# Patient Record
Sex: Female | Born: 1955 | Race: White | Hispanic: No | Marital: Married | Smoking: Never smoker
Health system: Southern US, Community
[De-identification: ages and names within clinical notes are randomized; demographics above are authoritative.]

## PROBLEM LIST (undated history)

## (undated) DIAGNOSIS — I10 Essential (primary) hypertension: Secondary | ICD-10-CM

## (undated) DIAGNOSIS — E119 Type 2 diabetes mellitus without complications: Secondary | ICD-10-CM

## (undated) HISTORY — PX: REPLACEMENT TOTAL KNEE: SUR1224

## (undated) HISTORY — PX: CARPAL TUNNEL RELEASE: SHX101

## (undated) HISTORY — PX: BACK SURGERY: SHX140

## (undated) HISTORY — PX: COLONOSCOPY WITH ESOPHAGOGASTRODUODENOSCOPY (EGD): SHX5779

## (undated) HISTORY — PX: ANKLE HARDWARE REMOVAL: SHX1149

## (undated) HISTORY — PX: FRACTURE SURGERY: SHX138

---

## 1993-12-15 HISTORY — PX: TUBAL LIGATION: SHX77

## 1995-12-16 HISTORY — PX: ABDOMINAL HYSTERECTOMY: SHX81

## 1999-07-17 ENCOUNTER — Encounter: Payer: Self-pay | Admitting: Neurosurgery

## 1999-07-19 ENCOUNTER — Inpatient Hospital Stay (HOSPITAL_COMMUNITY): Admission: RE | Admit: 1999-07-19 | Discharge: 1999-07-20 | Payer: Self-pay | Admitting: Neurosurgery

## 1999-07-19 ENCOUNTER — Encounter: Payer: Self-pay | Admitting: Neurosurgery

## 1999-10-08 ENCOUNTER — Encounter: Admission: RE | Admit: 1999-10-08 | Discharge: 1999-10-08 | Payer: Self-pay | Admitting: Neurosurgery

## 1999-10-08 ENCOUNTER — Encounter: Payer: Self-pay | Admitting: Neurosurgery

## 1999-12-11 ENCOUNTER — Other Ambulatory Visit: Admission: RE | Admit: 1999-12-11 | Discharge: 1999-12-11 | Payer: Self-pay | Admitting: Obstetrics and Gynecology

## 2000-01-24 ENCOUNTER — Encounter: Payer: Self-pay | Admitting: Neurosurgery

## 2000-01-24 ENCOUNTER — Encounter: Admission: RE | Admit: 2000-01-24 | Discharge: 2000-01-24 | Payer: Self-pay | Admitting: Neurosurgery

## 2000-01-28 ENCOUNTER — Encounter: Admission: RE | Admit: 2000-01-28 | Discharge: 2000-02-20 | Payer: Self-pay | Admitting: Neurosurgery

## 2000-02-17 ENCOUNTER — Encounter: Admission: RE | Admit: 2000-02-17 | Discharge: 2000-02-17 | Payer: Self-pay | Admitting: Neurosurgery

## 2000-02-17 ENCOUNTER — Encounter: Payer: Self-pay | Admitting: Neurosurgery

## 2000-02-21 ENCOUNTER — Encounter: Payer: Self-pay | Admitting: Neurosurgery

## 2000-02-21 ENCOUNTER — Ambulatory Visit (HOSPITAL_COMMUNITY): Admission: RE | Admit: 2000-02-21 | Discharge: 2000-02-21 | Payer: Self-pay | Admitting: Neurosurgery

## 2000-04-01 ENCOUNTER — Encounter: Admission: RE | Admit: 2000-04-01 | Discharge: 2000-06-30 | Payer: Self-pay | Admitting: Anesthesiology

## 2000-09-01 ENCOUNTER — Encounter: Admission: RE | Admit: 2000-09-01 | Discharge: 2000-09-01 | Payer: Self-pay | Admitting: Obstetrics and Gynecology

## 2000-09-01 ENCOUNTER — Encounter: Payer: Self-pay | Admitting: Obstetrics and Gynecology

## 2001-02-16 ENCOUNTER — Other Ambulatory Visit: Admission: RE | Admit: 2001-02-16 | Discharge: 2001-02-16 | Payer: Self-pay | Admitting: Obstetrics and Gynecology

## 2001-07-22 ENCOUNTER — Encounter: Payer: Self-pay | Admitting: Neurosurgery

## 2001-07-22 ENCOUNTER — Ambulatory Visit (HOSPITAL_COMMUNITY): Admission: RE | Admit: 2001-07-22 | Discharge: 2001-07-22 | Payer: Self-pay | Admitting: Neurosurgery

## 2001-08-09 ENCOUNTER — Encounter: Payer: Self-pay | Admitting: Neurosurgery

## 2001-08-09 ENCOUNTER — Ambulatory Visit (HOSPITAL_COMMUNITY): Admission: RE | Admit: 2001-08-09 | Discharge: 2001-08-09 | Payer: Self-pay | Admitting: Neurosurgery

## 2001-09-02 ENCOUNTER — Encounter: Payer: Self-pay | Admitting: Family Medicine

## 2001-09-02 ENCOUNTER — Encounter: Admission: RE | Admit: 2001-09-02 | Discharge: 2001-09-02 | Payer: Self-pay | Admitting: Family Medicine

## 2001-09-07 ENCOUNTER — Emergency Department (HOSPITAL_COMMUNITY): Admission: EM | Admit: 2001-09-07 | Discharge: 2001-09-07 | Payer: Self-pay | Admitting: *Deleted

## 2002-02-16 ENCOUNTER — Other Ambulatory Visit: Admission: RE | Admit: 2002-02-16 | Discharge: 2002-02-16 | Payer: Self-pay | Admitting: Obstetrics and Gynecology

## 2002-08-30 ENCOUNTER — Ambulatory Visit (HOSPITAL_COMMUNITY): Admission: RE | Admit: 2002-08-30 | Discharge: 2002-08-30 | Payer: Self-pay | Admitting: Neurosurgery

## 2002-08-30 ENCOUNTER — Encounter: Payer: Self-pay | Admitting: Neurosurgery

## 2002-09-26 ENCOUNTER — Encounter: Payer: Self-pay | Admitting: Family Medicine

## 2002-09-26 ENCOUNTER — Encounter: Admission: RE | Admit: 2002-09-26 | Discharge: 2002-09-26 | Payer: Self-pay | Admitting: Family Medicine

## 2003-10-06 ENCOUNTER — Encounter: Payer: Self-pay | Admitting: Obstetrics and Gynecology

## 2003-10-06 ENCOUNTER — Encounter: Admission: RE | Admit: 2003-10-06 | Discharge: 2003-10-06 | Payer: Self-pay | Admitting: Obstetrics and Gynecology

## 2004-05-29 ENCOUNTER — Encounter: Admission: RE | Admit: 2004-05-29 | Discharge: 2004-05-29 | Payer: Self-pay | Admitting: Family Medicine

## 2004-08-01 ENCOUNTER — Ambulatory Visit (HOSPITAL_COMMUNITY): Admission: RE | Admit: 2004-08-01 | Discharge: 2004-08-01 | Payer: Self-pay | Admitting: Internal Medicine

## 2004-12-13 ENCOUNTER — Encounter: Admission: RE | Admit: 2004-12-13 | Discharge: 2004-12-13 | Payer: Self-pay | Admitting: Obstetrics and Gynecology

## 2004-12-25 ENCOUNTER — Encounter: Admission: RE | Admit: 2004-12-25 | Discharge: 2004-12-25 | Payer: Self-pay | Admitting: Obstetrics and Gynecology

## 2006-02-17 ENCOUNTER — Encounter: Admission: RE | Admit: 2006-02-17 | Discharge: 2006-02-17 | Payer: Self-pay | Admitting: Obstetrics and Gynecology

## 2006-06-08 ENCOUNTER — Encounter: Admission: RE | Admit: 2006-06-08 | Discharge: 2006-06-08 | Payer: Self-pay | Admitting: Neurosurgery

## 2006-09-29 ENCOUNTER — Ambulatory Visit (HOSPITAL_COMMUNITY): Admission: RE | Admit: 2006-09-29 | Discharge: 2006-09-30 | Payer: Self-pay | Admitting: Neurosurgery

## 2007-05-21 ENCOUNTER — Encounter: Admission: RE | Admit: 2007-05-21 | Discharge: 2007-05-21 | Payer: Self-pay | Admitting: Neurosurgery

## 2007-06-28 ENCOUNTER — Ambulatory Visit (HOSPITAL_COMMUNITY): Admission: RE | Admit: 2007-06-28 | Discharge: 2007-06-29 | Payer: Self-pay | Admitting: Chiropractic Medicine

## 2007-08-31 ENCOUNTER — Encounter: Admission: RE | Admit: 2007-08-31 | Discharge: 2007-08-31 | Payer: Self-pay | Admitting: Obstetrics and Gynecology

## 2008-06-17 IMAGING — CR DG LUMBAR SPINE 1V
1 series · 1 of 1 positions shown · non-contrast
Comparison: none

CLINICAL DATA: 51 year old female; L5-S1 microdiskectomy for herniated disk.
DIAGNOSTIC INTRAOPERATIVE LUMBAR SPINE ? 1 VIEW ? 06/28/07:

[view not recorded]
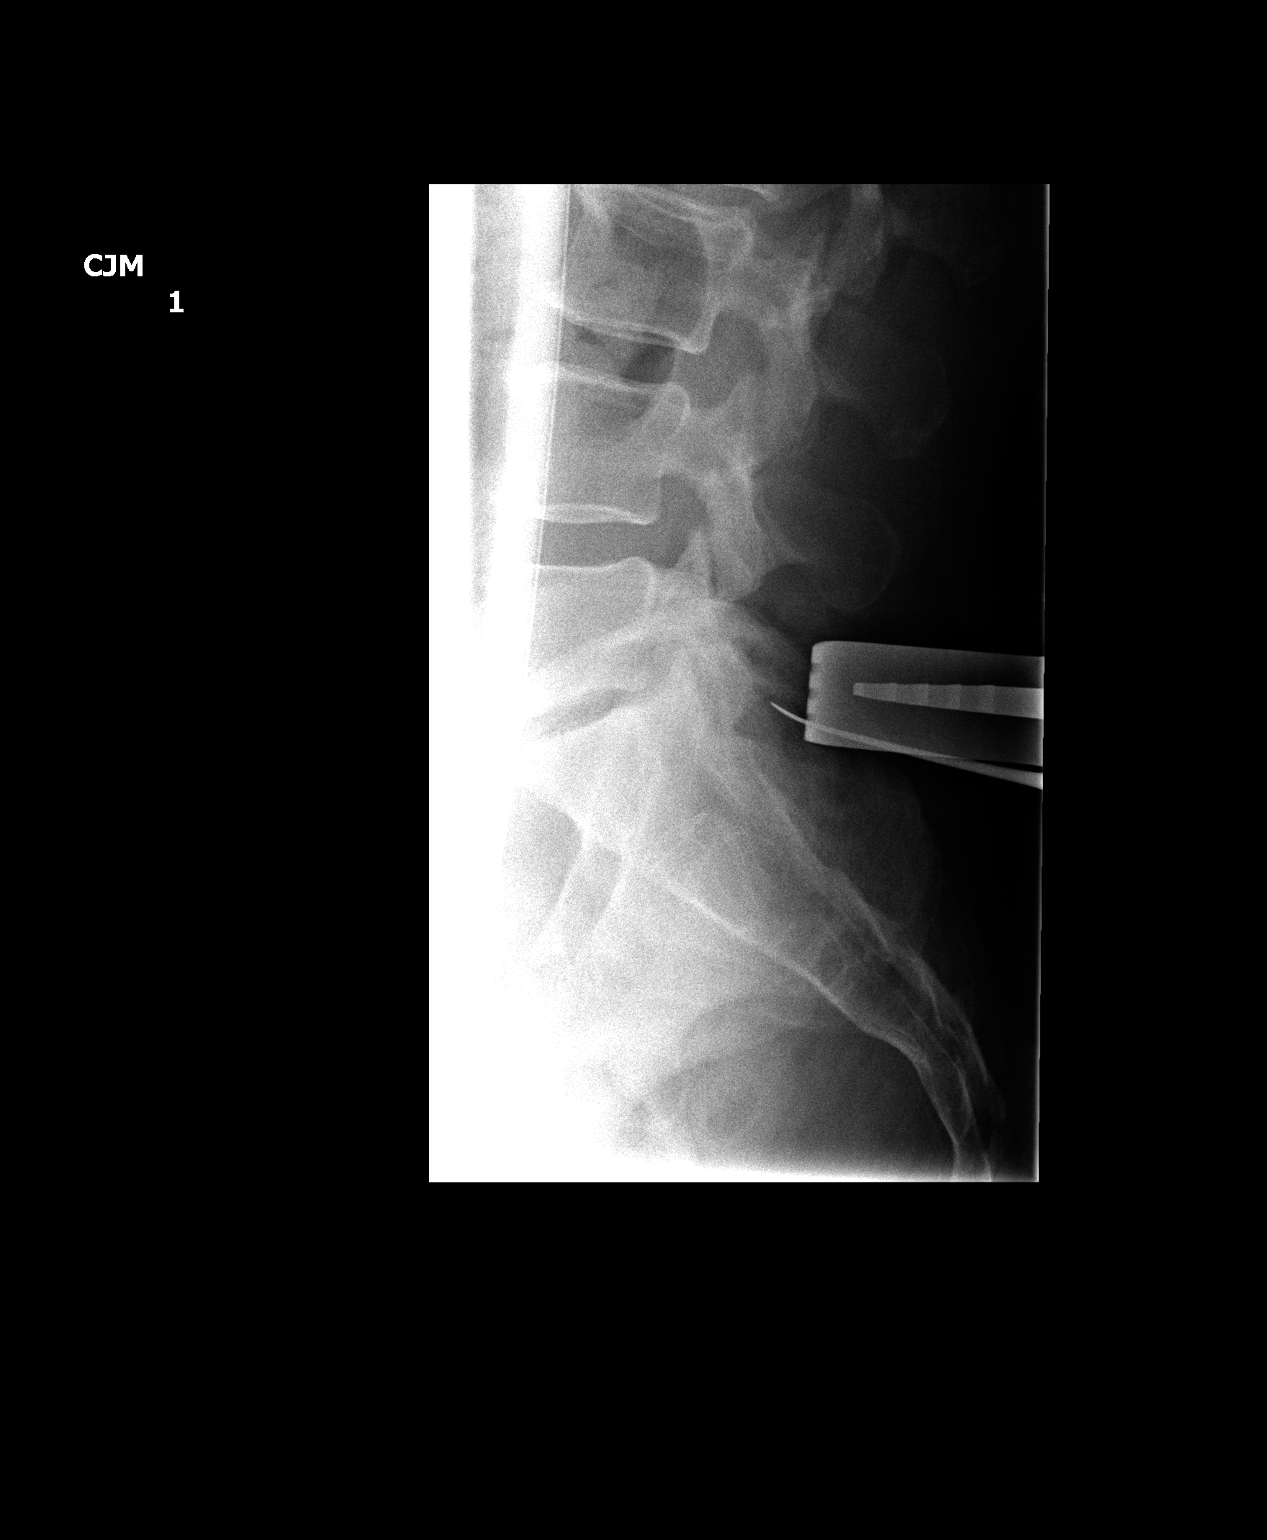

[1 of 1 positions shown; findings below may reference images not displayed]

FINDINGS: Intraoperative lateral lumbar spine demonstrates a surgical retractor and a localizer posterior to L5-S1.
IMPRESSION: Operative localization L5-S1.

## 2008-09-19 ENCOUNTER — Ambulatory Visit (HOSPITAL_BASED_OUTPATIENT_CLINIC_OR_DEPARTMENT_OTHER): Admission: RE | Admit: 2008-09-19 | Discharge: 2008-09-19 | Payer: Self-pay | Admitting: Obstetrics and Gynecology

## 2009-11-27 ENCOUNTER — Ambulatory Visit (HOSPITAL_BASED_OUTPATIENT_CLINIC_OR_DEPARTMENT_OTHER): Admission: RE | Admit: 2009-11-27 | Discharge: 2009-11-27 | Payer: Self-pay | Admitting: Obstetrics and Gynecology

## 2009-11-27 ENCOUNTER — Ambulatory Visit: Payer: Self-pay | Admitting: Diagnostic Radiology

## 2010-12-09 ENCOUNTER — Ambulatory Visit (HOSPITAL_BASED_OUTPATIENT_CLINIC_OR_DEPARTMENT_OTHER)
Admission: RE | Admit: 2010-12-09 | Discharge: 2010-12-09 | Payer: Self-pay | Source: Home / Self Care | Attending: Obstetrics and Gynecology | Admitting: Obstetrics and Gynecology

## 2011-04-29 NOTE — Op Note (Signed)
Brandi Frost, Brandi Frost              ACCOUNT NO.:  [id number]   MEDICAL RECORD NO.:  [id number]          PATIENT TYPE:  OIB   LOCATION:  3008                         FACILITY:  [facility]   PHYSICIAN:  Kathaleen Maser. Pool, M.D.    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   POSTOPERATIVE DIAGNOSES:  Left L5-S1 recurrent herniated nucleus  pulposus with radiculopathy.   PROCEDURE NOTE:  Left L5-S1 re-exploration of laminotomy with a redo  microdiskectomy.   SURGEON:  Kathaleen Maser. Pool, M.D.   ASSISTANT:  Dionne Ano. Amanda Pea, M.D.   ANESTHESIA:  General endotracheal anesthesia.   INDICATIONS FOR PROCEDURE:  This is a 55 year old female with history of  left lower extremity pain, paresthesias and weakness consistent with a  left-sided S1 radiculopathy.  Workup demonstrates evidence of a left-  sided L5-S1 recurrent disk herniation with significant compression of  the left side S-1 nerve root.  The patient has been counseled as to her  options.  She decided to proceed with a left-sided L5-S1 redo  microdiskectomy in hopes of improving her symptoms.   DESCRIPTION OF PROCEDURE:  The patient was brought to the operating  room, placed on the operating table in the supine position.  After  adequate level of anesthesia was achieved, the patient was placed prone  in a Wilson frame and appropriately padded.  The patient's lumbar  regions was prepped and draped sterilely.  A #10 blade was used to make  a skin incision overlying the L5-S1 interspace.  This was carried down  sharply in the midline.  A subperiosteal dissection was then performed,  exposing the lamina of the facet joints of L-5 and S1 on the left side.  Previous laminotomy site of L5-S1 was dissected free.  X-ray was taken,  level was confirmed.  Microscope was  brought into the field.  Microdissection of the laminotomy of L5-S1.  The S1 nerve root was  identified.  The S1 nerve root was then gradually mobilized and  retracted towards the midline.  Disk herniation was apparent.  This was  then incised with a 15 blade in a rectangular fashion.  A wide disk  space clean-out was then achieved using pituitary rongeurs, up going  pituitary rongeurs and Epstein curettes.  All elements of the recurrent  disk herniation were completely resected.  All loose or obviously  degenerative disc material was removed from the interspace.  At this  point a very thorough decompression had been achieved.  There was no  injury to thecal sac or nerve roots.  The wound was then irrigated with  antibiotic solution.  Gelfoam was placed topically.  Hemostasis found to  be good.  Microscope and  retraction system were removed.  Hemostasis of the muscle achieved with  electrocautery.  The wound was closed in layers with Vicryl suture.  Steri-Strips and sterile dressings were applied.  There were no apparent  complications.  The patient tolerated the procedure  well and she  returned to the recovery room postoperatively.           ______________________________  Kathaleen Maser Pool, M.D.     HAP/MEDQ  D:  06/28/2007  T:  06/29/2007  Job:  [id number]

## 2011-05-02 NOTE — Procedures (Signed)
[place]. [facility]  Patient:    Brandi Frost, Brandi Frost                   MRN: [id number] Proc. Date: 04/10/00 Adm. Date:  [id number] Attending:  Thyra Breed CC:         Mena Goes. Franky Macho, M.D.             Vikki Ports, M.D.                           Procedure Report  PROCEDURE:  Right occipital nerve block.  OPERATOR:  Thyra Breed, M.D.  PREOPERATIVE DIAGNOSIS:  Occipital neuralgia.  INDICATIONS:  The patient has noted improvement after her first injection into he right occipital region.  She is interested in another injection today.  She has had no untoward effects.  PHYSICAL EXAMINATION:  VITAL SIGNS:  Blood pressure 140/74, heart rate 93, respirations 16, O2 saturation 98%.  Pain level is 4/10.  Temperature is 97.6 degrees.  NEUROLOGIC:  Grossly unchanged.  DESCRIPTION OF PROCEDURE:  After an informed consent was obtained, I identified the right greater occipital groove and prepped this with Betadine x 3.  Using a mixture of 1% lidocaine to 0.5% levobupivacaine in a ratio of 1:2, I injected 2 cc using a #25 gauge needle.  Fifteen minutes later the patient noted a very good occipital nerve block.  The post-procedural condition is stable.  DISCHARGE INSTRUCTIONS: 1. Resume the previous diet. 2. Limitation in activities per the instruction sheet. 3. Continue with the current medications. 4. Follow up with me next week for the third occipital nerve block. DD:  04/10/00 TD:  04/12/00 Job: [id number]

## 2011-05-02 NOTE — Procedures (Signed)
[place]. [facility]  Patient:    Brandi Frost, Brandi Frost                   MRN: [id number] Proc. Date: 04/17/00 Adm. Date:  [id number] Attending:  Thyra Breed CC:         Mena Goes. Franky Macho, M.D.             Vikki Ports, M.D.                           Procedure Report  NAME OF PROCEDURE:  Right occipital nerve block.  DIAGNOSIS:  Occipital neuralgias and headaches.  ANESTHESIOLOGIST:  Florencia Reasons, M.D.  INTERVAL HISTORY:  The patient has noted improvement, which is lasting about seven days with the injections.  We discussed going ahead and adding in some corticosteroids today, which she is interested in doing.  She has had some mild discomfort on the left side.  PHYSICAL EXAMINATION:  VITAL SIGNS:  Blood pressure is 139/85.  Heart rate is 90.  Respiratory rate is 18.  O2 sat is 96%.  The pain level is 8/10 and temperature is 97.6.  GENERAL:  She exhibits tenderness over the right greater occipital and lesser occipital grooves.  NEUROLOGIC:  Her neuro exam is grossly unchanged.  PROCEDURE:  After informed consent was obtained the patient was placed in the sitting position and monitored.  I went ahead and prepped out the greater and lesser occipital grooves with Betadine x3.  Using a mixture of 1% lidocaine to 0.5% levobupivacaine in a ratio of 1:13 with 5 mg/mL of Medrol I injected 1.5 cc at each site using a 25-gauge needle.  Fifteen minutes later the patient noted good pain control with decreased sensation in the occipital nerve distribution on the right side.  POSTPROCEDURE CONDITION:  Postprocedure condition was stable.  DISCHARGE INSTRUCTIONS: 1. Resume previous diet. 2. Limitation of activities per instruction sheet. 3. Continue on current medications. 4. The patient with plans to follow up with Drs. Cabbell and [place]. 5. I advised her I would like to see her back in nine weeks to consider    repeating the epidural steroid  injection. DD:  04/17/00 TD:  04/21/00 Job: [id number]

## 2011-05-02 NOTE — Op Note (Signed)
Brandi Frost, Brandi Frost              ACCOUNT NO.:  [id number]   MEDICAL RECORD NO.:  [id number]          PATIENT TYPE:  AMB   LOCATION:  SDS                          FACILITY:  [facility]   PHYSICIAN:  Henry A. Pool, M.D.    DATE OF BIRTH:  1956/04/02   DATE OF PROCEDURE:  09/29/2006  DATE OF DISCHARGE:                                 OPERATIVE REPORT   PREOPERATIVE DIAGNOSES:  Left L5-S1 herniated nucleus pulposus with  radiculopathy.   POSTOPERATIVE DIAGNOSES:  Left L5-S1 herniated nucleus pulposus with  radiculopathy.   OPERATION PERFORMED:  Left L5-S1 laminotomy and microdiskectomy.   SURGEON:  Kathaleen Maser. Pool, M.D.   ASSISTANT:  Tia Alert, MD   ANESTHESIA:  General orotracheal.   INDICATIONS FOR PROCEDURE:  The patient is a 55 year old female with a  history of left lower extremity pain, paresthesias and weakness, consistent  with a left-sided S1 radiculopathy.  She has failed conservative management.  Work-up demonstrates evidence of a left-sided L5-S1 disk herniation with  compression of the left-sided S1 nerve root.  The patient has failed  conservative management.  We decided to proceed with a left-sided L5-S1  laminotomy and microdiskectomy in hopes of improving her symptoms.   DESCRIPTION OF PROCEDURE:  The patient was taken to the operating room and  placed on the table in the supine position.  After adequate level of  anesthesia was achieved, the patient was positioned prone onto a Wilson  frame and appropriately padded.  The patient's lumbar region was prepped and  draped sterilely.  A 10 blade was used to make a linear skin incision  overlying the L5-S1 interspace.  This was carried down sharply in the  midline.  A subperiosteal dissection was then performed exposing the lamina  and facet joints of L5 and S1 on the left.  Deep self-retaining retractor  was placed.  Intraoperative x-ray was taken, the level was confirmed.  The  laminotomy was then performed using  high speed drill and Kerrison rongeurs  to remove the inferior edge of the lamina at L5,  medial aspect of the L5-S1  facet joint and the superior rim of the S1 lamina.  Ligamentum flavum was  then elevated and resected in piecemeal fashion using Kerrison rongeurs.  Underlying thecal sac and exiting S1 nerve root were identified.  The  microscope was brought into the field and used for microdissection of the  left-sided S1 nerve root and underlying disk herniation.  Epidural venous  plexus was coagulated and cut.  The thecal sac and S1 nerve root were  mobilized and retracted toward the midline.  Disk herniation was readily  apparent.  This was incised with a 15 blade in rectangular fashion.  A wide  disk space cleanout was then achieved using pituitary rongeurs and upward  angled pituitary rongeurs and Epstein curets.  All elements of disk herniation were completely resected.  All loose or  obviously degenerated disk material was removed from the interspace.  At  this point a very thorough diskectomy had been achieved.  There was no  evidence of injury to thecal  sac or nerve roots.  The wound was then  irrigated with antibiotic solution.  Gelfoam was placed topically for  hemostasis which was found to be good.  Microscope and retractor system were  removed.  Hemostasis  in muscle was achieved with electrocautery.  The wound was then closed in  layers with Vicryl sutures.  Steri-Strips and sterile dressing were applied.  There were no apparent complications.  The patient tolerated the procedure  well and she returned to the recovery room postoperatively.           ______________________________  Kathaleen Maser Pool, M.D.     HAP/MEDQ  D:  09/29/2006  T:  09/30/2006  Job:  [id number]

## 2011-09-30 LAB — TYPE AND SCREEN
ABO/RH(D): B POS
Antibody Screen: NEGATIVE

## 2011-09-30 LAB — BASIC METABOLIC PANEL
BUN: 19
CO2: 28
Chloride: 100
Creatinine, Ser: 0.9
Glucose, Bld: 104 — ABNORMAL HIGH

## 2011-09-30 LAB — CBC
MCHC: 33.5
MCV: 87.5
Platelets: 264
RDW: 13.5
WBC: 8.1

## 2011-09-30 LAB — DIFFERENTIAL
Basophils Relative: 1
Eosinophils Absolute: 0.1
Eosinophils Relative: 1
Neutrophils Relative %: 63

## 2012-04-19 ENCOUNTER — Other Ambulatory Visit (HOSPITAL_BASED_OUTPATIENT_CLINIC_OR_DEPARTMENT_OTHER): Payer: Self-pay | Admitting: Obstetrics and Gynecology

## 2012-04-19 DIAGNOSIS — Z1231 Encounter for screening mammogram for malignant neoplasm of breast: Secondary | ICD-10-CM

## 2012-04-20 ENCOUNTER — Ambulatory Visit (HOSPITAL_BASED_OUTPATIENT_CLINIC_OR_DEPARTMENT_OTHER)
Admission: RE | Admit: 2012-04-20 | Discharge: 2012-04-20 | Disposition: A | Discharge status: Home / Self Care | Payer: BC Managed Care – PPO | Source: Ambulatory Visit | Attending: Obstetrics and Gynecology | Admitting: Obstetrics and Gynecology

## 2012-04-20 DIAGNOSIS — Z1231 Encounter for screening mammogram for malignant neoplasm of breast: Secondary | ICD-10-CM | POA: Insufficient documentation

## 2013-09-15 ENCOUNTER — Other Ambulatory Visit (HOSPITAL_BASED_OUTPATIENT_CLINIC_OR_DEPARTMENT_OTHER): Payer: Self-pay | Admitting: Obstetrics and Gynecology

## 2013-09-15 DIAGNOSIS — Z1231 Encounter for screening mammogram for malignant neoplasm of breast: Secondary | ICD-10-CM

## 2013-09-19 ENCOUNTER — Ambulatory Visit (HOSPITAL_BASED_OUTPATIENT_CLINIC_OR_DEPARTMENT_OTHER)
Admission: RE | Admit: 2013-09-19 | Discharge: 2013-09-19 | Disposition: A | Discharge status: Home / Self Care | Payer: BC Managed Care – PPO | Source: Ambulatory Visit | Attending: Obstetrics and Gynecology | Admitting: Obstetrics and Gynecology

## 2013-09-19 DIAGNOSIS — Z1231 Encounter for screening mammogram for malignant neoplasm of breast: Secondary | ICD-10-CM | POA: Insufficient documentation

## 2014-10-11 ENCOUNTER — Other Ambulatory Visit (HOSPITAL_BASED_OUTPATIENT_CLINIC_OR_DEPARTMENT_OTHER): Payer: Self-pay | Admitting: Obstetrics and Gynecology

## 2014-10-11 DIAGNOSIS — Z1231 Encounter for screening mammogram for malignant neoplasm of breast: Secondary | ICD-10-CM

## 2014-10-17 ENCOUNTER — Ambulatory Visit (HOSPITAL_BASED_OUTPATIENT_CLINIC_OR_DEPARTMENT_OTHER): Payer: BC Managed Care – PPO

## 2014-10-20 ENCOUNTER — Ambulatory Visit (HOSPITAL_BASED_OUTPATIENT_CLINIC_OR_DEPARTMENT_OTHER)
Admission: RE | Admit: 2014-10-20 | Discharge: 2014-10-20 | Disposition: A | Discharge status: Home / Self Care | Payer: BC Managed Care – PPO | Source: Ambulatory Visit | Attending: Obstetrics and Gynecology | Admitting: Obstetrics and Gynecology

## 2014-10-20 DIAGNOSIS — Z1231 Encounter for screening mammogram for malignant neoplasm of breast: Secondary | ICD-10-CM | POA: Insufficient documentation

## 2015-10-10 IMAGING — MG MM DIGITAL SCREENING BILAT W/ CAD
4 series · 4 of 4 positions shown · non-contrast
Comparison: Previous exam(s).

CLINICAL DATA: Screening.

EXAM:
DIGITAL SCREENING BILATERAL MAMMOGRAM WITH CAD

[R CC]
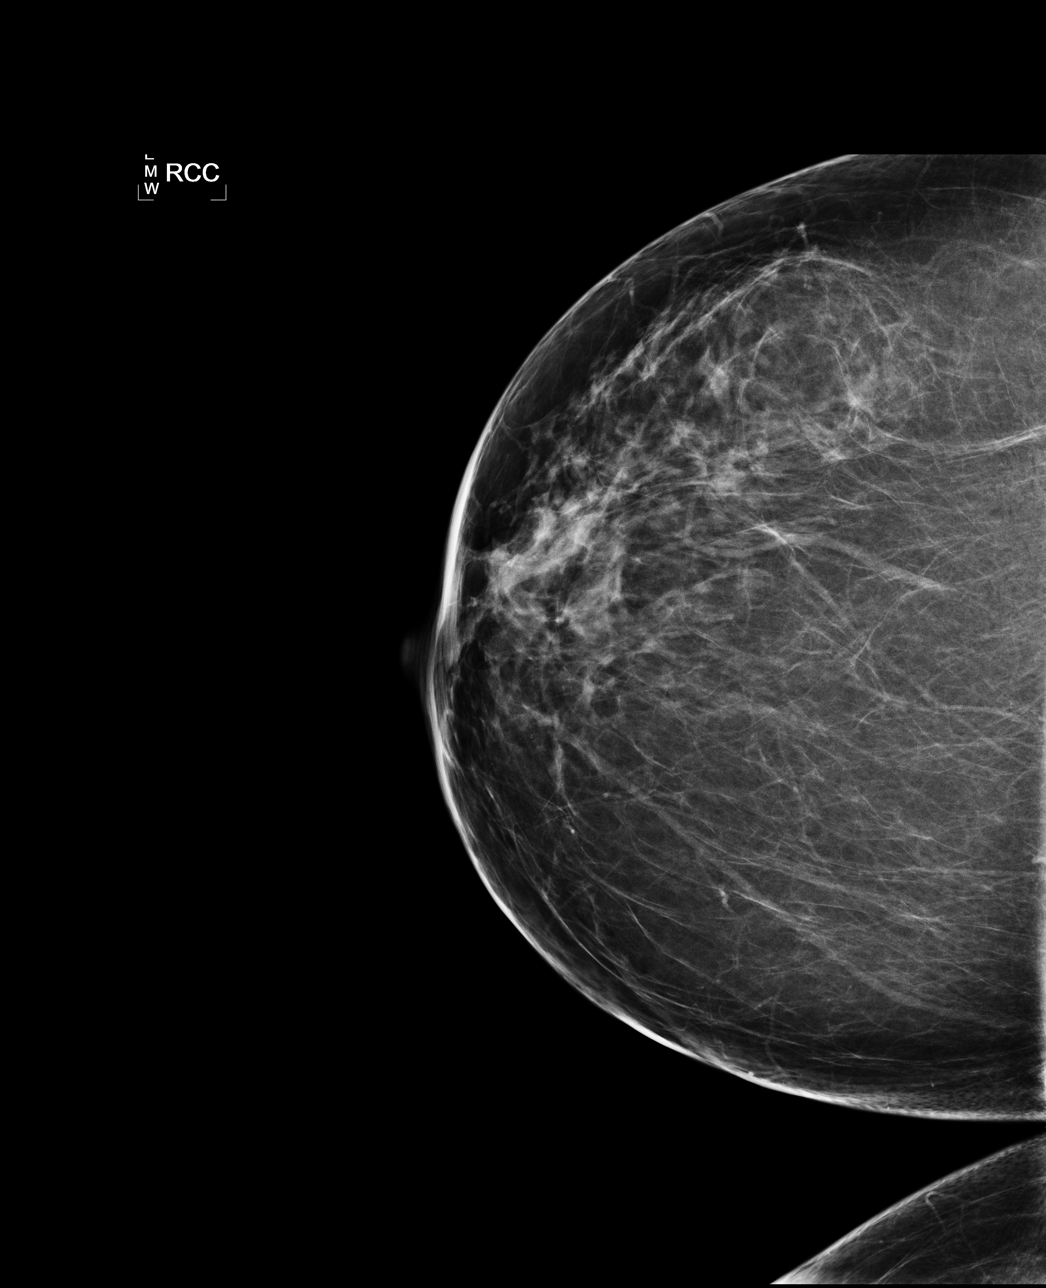

[L CC]
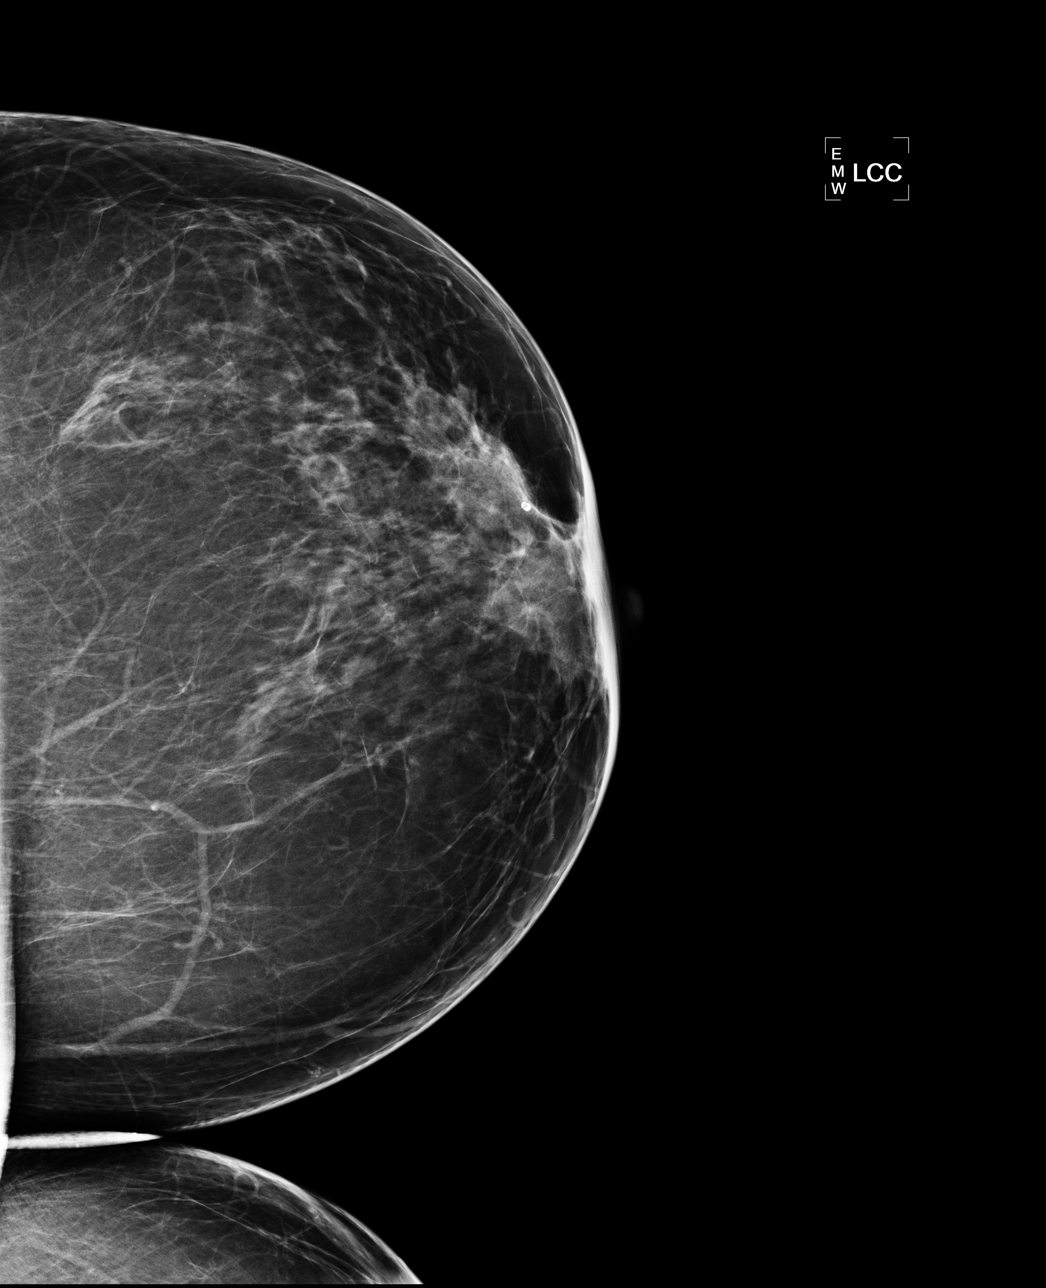

[L MLO]
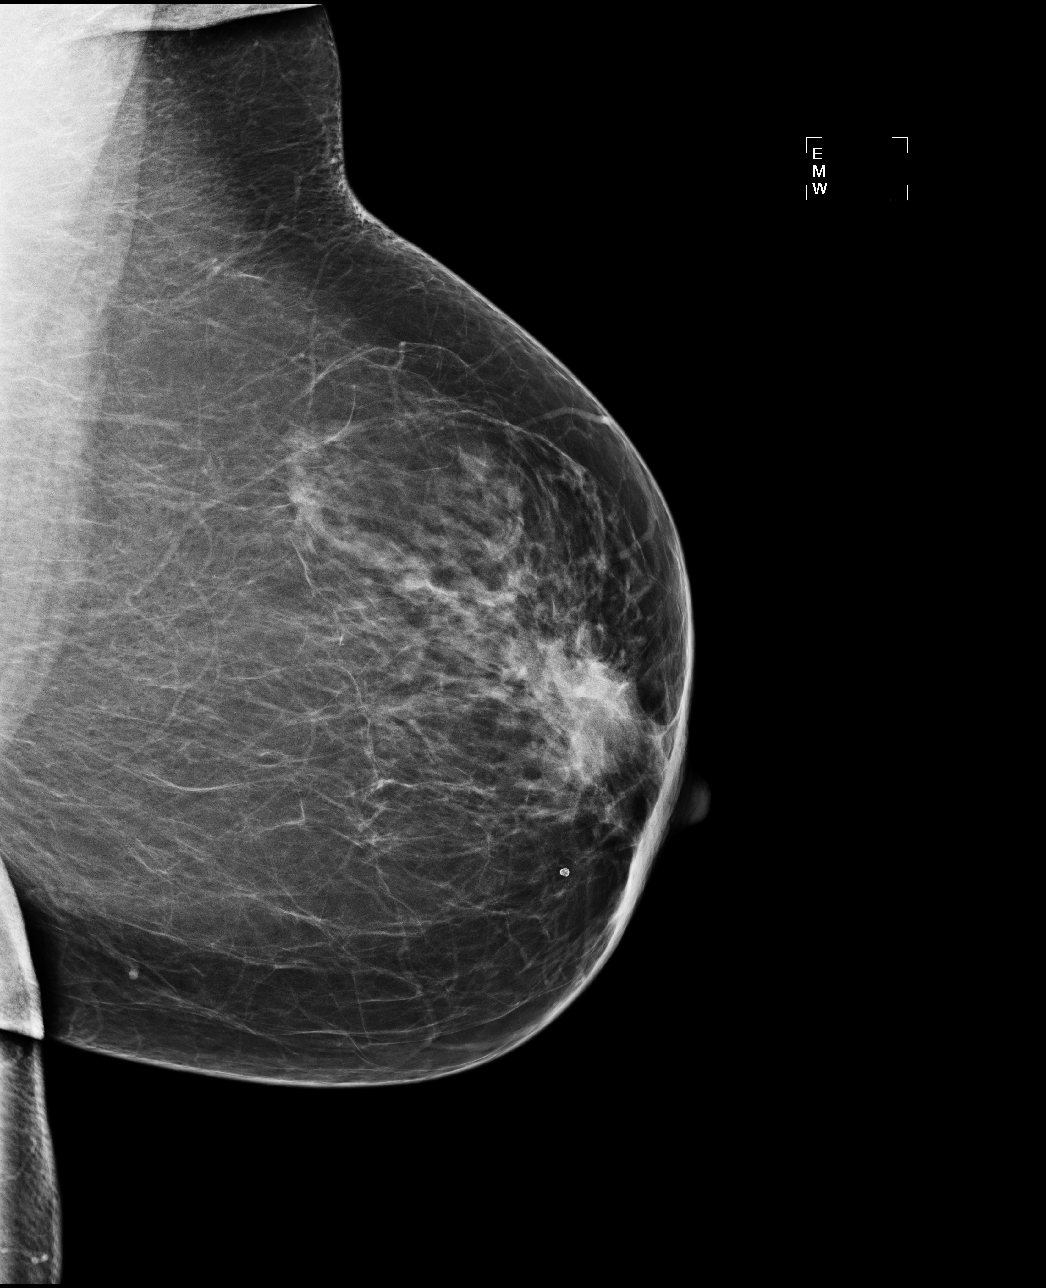

[R MLO]
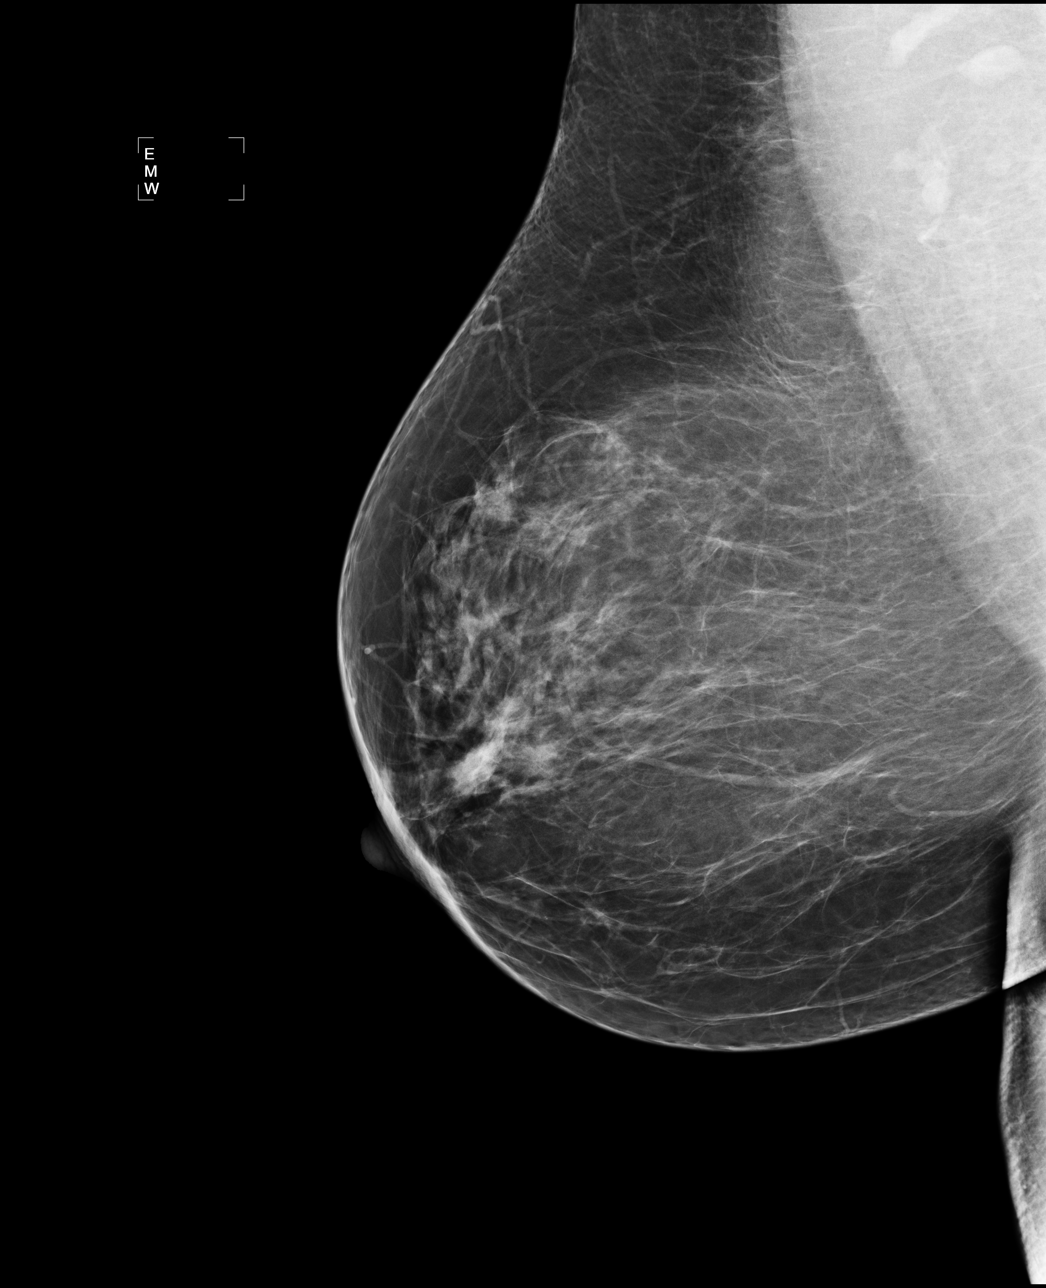

[4 of 4 positions shown; findings below may reference images not displayed]

ACR Breast Density Category b: There are scattered areas of
fibroglandular density.
FINDINGS: There are no findings suspicious for malignancy. Images were
processed with CAD.
IMPRESSION: No mammographic evidence of malignancy. A result letter of this
screening mammogram will be mailed directly to the patient.

RECOMMENDATION:
Screening mammogram in one year. (Code:AS-G-LCT)

BI-RADS CATEGORY  1: Negative.

## 2016-12-15 HISTORY — PX: CHOLECYSTECTOMY: SHX55

## 2017-01-15 ENCOUNTER — Other Ambulatory Visit (HOSPITAL_BASED_OUTPATIENT_CLINIC_OR_DEPARTMENT_OTHER): Payer: Self-pay | Admitting: Obstetrics and Gynecology

## 2017-01-15 DIAGNOSIS — Z1231 Encounter for screening mammogram for malignant neoplasm of breast: Secondary | ICD-10-CM

## 2017-01-26 ENCOUNTER — Ambulatory Visit (HOSPITAL_BASED_OUTPATIENT_CLINIC_OR_DEPARTMENT_OTHER): Payer: Self-pay

## 2020-12-15 HISTORY — PX: COLONOSCOPY: SHX174

## 2022-09-04 ENCOUNTER — Other Ambulatory Visit: Payer: Self-pay | Admitting: Neurosurgery

## 2022-09-16 NOTE — Pre-Procedure Instructions (Signed)
Surgical Instructions    Your procedure is scheduled on September 22, 2022.  Report to [facility] Main Entrance "A" at 6:00 A.M., then check in with the Admitting office.  Call this number if you have problems the morning of surgery:  [phone]   If you have any questions prior to your surgery date call [phone]: Open Monday-Friday 8am-4pm    Remember:  Do not eat or drink after midnight the night before your surgery     Take these medicines the morning of surgery with A SIP OF WATER:  amLODipine (NORVASC)  atorvastatin (LIPITOR)   carvedilol (COREG)  doxazosin (CARDURA)  gabapentin (NEURONTIN)  HYDROcodone-acetaminophen (NORCO) - may take as needed   As of today, STOP taking any Aspirin (unless otherwise instructed by your surgeon) Aleve, Naproxen, Ibuprofen, Motrin, Advil, Goody's, BC's, all herbal medications, fish oil, and all vitamins.   WHAT DO I DO ABOUT MY DIABETES MEDICATION?   Do not take metFORMIN (GLUCOPHAGE-XR) or pioglitazone (ACTOS) the morning of surgery.  If you take glipiZIDE (GLUCOTROL XL) in the evening, DO NOT take evening dose night before surgery   HOW TO MANAGE YOUR DIABETES BEFORE AND AFTER SURGERY  Why is it important to control my blood sugar before and after surgery? Improving blood sugar levels before and after surgery helps healing and can limit problems. A way of improving blood sugar control is eating a healthy diet by:  Eating less sugar and carbohydrates  Increasing activity/exercise  Talking with your doctor about reaching your blood sugar goals High blood sugars (greater than 180 mg/dL) can raise your risk of infections and slow your recovery, so you will need to focus on controlling your diabetes during the weeks before surgery. Make sure that the doctor who takes care of your diabetes knows about your planned surgery including the date and location.  How do I manage my blood sugar before surgery? Check your blood sugar at least  4 times a day, starting 2 days before surgery, to make sure that the level is not too high or low.  Check your blood sugar the morning of your surgery when you wake up and every 2 hours until you get to the Short Stay unit.  If your blood sugar is less than 70 mg/dL, you will need to treat for low blood sugar: Do not take insulin. Treat a low blood sugar (less than 70 mg/dL) with  cup of clear juice (cranberry or apple), 4 glucose tablets, OR glucose gel. Recheck blood sugar in 15 minutes after treatment (to make sure it is greater than 70 mg/dL). If your blood sugar is not greater than 70 mg/dL on recheck, call [phone] for further instructions. Report your blood sugar to the short stay nurse when you get to Short Stay.  If you are admitted to the hospital after surgery: Your blood sugar will be checked by the staff and you will probably be given insulin after surgery (instead of oral diabetes medicines) to make sure you have good blood sugar levels. The goal for blood sugar control after surgery is 80-180 mg/dL.                      Do NOT Smoke (Tobacco/Vaping) for 24 hours prior to your procedure.  If you use a CPAP at night, you may bring your mask/headgear for your overnight stay.   Contacts, glasses, piercing's, hearing aid's, dentures or partials may not be worn into surgery, please bring cases for these belongings.  For patients admitted to the hospital, discharge time will be determined by your treatment team.   Patients discharged the day of surgery will not be allowed to drive home, and someone needs to stay with them for 24 hours.  SURGICAL WAITING ROOM VISITATION Patients having surgery or a procedure may have no more than 2 support people in the waiting area - these visitors may rotate.   Children under the age of 19 must have an adult with them who is not the patient. If the patient needs to stay at the hospital during part of their recovery, the visitor guidelines  for inpatient rooms apply. Pre-op nurse will coordinate an appropriate time for 1 support person to accompany patient in pre-op.  This support person may not rotate.   Please refer to the [facility] website for the visitor guidelines for Inpatients (after your surgery is over and you are in a regular room).    Special instructions:   [place]- Preparing For Surgery  Before surgery, you can play an important role. Because skin is not sterile, your skin needs to be as free of germs as possible. You can reduce the number of germs on your skin by washing with CHG (chlorahexidine gluconate) Soap before surgery.  CHG is an antiseptic cleaner which kills germs and bonds with the skin to continue killing germs even after washing.    Oral Hygiene is also important to reduce your risk of infection.  Remember - BRUSH YOUR TEETH THE MORNING OF SURGERY WITH YOUR REGULAR TOOTHPASTE  Please do not use if you have an allergy to CHG or antibacterial soaps. If your skin becomes reddened/irritated stop using the CHG.  Do not shave (including legs and underarms) for at least 48 hours prior to first CHG shower. It is OK to shave your face.  Please follow these instructions carefully.   Shower the NIGHT BEFORE SURGERY and the MORNING OF SURGERY  If you chose to wash your hair, wash your hair first as usual with your normal shampoo.  After you shampoo, rinse your hair and body thoroughly to remove the shampoo.  Use CHG Soap as you would any other liquid soap. You can apply CHG directly to the skin and wash gently with a scrungie or a clean washcloth.   Apply the CHG Soap to your body ONLY FROM THE NECK DOWN.  Do not use on open wounds or open sores. Avoid contact with your eyes, ears, mouth and genitals (private parts). Wash Face and genitals (private parts)  with your normal soap.   Wash thoroughly, paying special attention to the area where your surgery will be performed.  Thoroughly rinse your body with  warm water from the neck down.  DO NOT shower/wash with your normal soap after using and rinsing off the CHG Soap.  Pat yourself dry with a CLEAN TOWEL.  Wear CLEAN PAJAMAS to bed the night before surgery  Place CLEAN SHEETS on your bed the night before your surgery  DO NOT SLEEP WITH PETS.   Day of Surgery: Take a shower with CHG soap. Do not wear jewelry or makeup Do not wear lotions, powders, perfumes/colognes, or deodorant. Do not shave 48 hours prior to surgery.  Men may shave face and neck. Do not bring valuables to the hospital.  [facility] is not responsible for any belongings or valuables. Do not wear nail polish, gel polish, artificial nails, or any other type of covering on natural nails (fingers and toes) If you have  artificial nails or gel coating that need to be removed by a nail salon, please have this removed prior to surgery. Artificial nails or gel coating may interfere with anesthesia's ability to adequately monitor your vital signs.  Wear Clean/Comfortable clothing the morning of surgery Remember to brush your teeth WITH YOUR REGULAR TOOTHPASTE.   Please read over the following fact sheets that you were given.    If you received a COVID test during your pre-op visit  it is requested that you wear a mask when out in public, stay away from anyone that may not be feeling well and notify your surgeon if you develop symptoms. If you have been in contact with anyone that has tested positive in the last 10 days please notify you surgeon.

## 2022-09-17 ENCOUNTER — Encounter (HOSPITAL_COMMUNITY): Payer: Self-pay

## 2022-09-17 ENCOUNTER — Encounter (HOSPITAL_COMMUNITY)
Admission: RE | Admit: 2022-09-17 | Discharge: 2022-09-17 | Disposition: A | Discharge status: Home / Self Care | Payer: Medicare HMO | Source: Ambulatory Visit | Attending: Neurosurgery | Admitting: Neurosurgery

## 2022-09-17 ENCOUNTER — Other Ambulatory Visit: Payer: Self-pay

## 2022-09-17 VITALS — BP 129/55 | HR 79 | Temp 98.0°F | Resp 19 | Ht 65.0 in | Wt 233.5 lb

## 2022-09-17 DIAGNOSIS — E119 Type 2 diabetes mellitus without complications: Secondary | ICD-10-CM | POA: Diagnosis not present

## 2022-09-17 DIAGNOSIS — R9431 Abnormal electrocardiogram [ECG] [EKG]: Secondary | ICD-10-CM | POA: Diagnosis not present

## 2022-09-17 DIAGNOSIS — Z01818 Encounter for other preprocedural examination: Secondary | ICD-10-CM | POA: Diagnosis present

## 2022-09-17 DIAGNOSIS — E785 Hyperlipidemia, unspecified: Secondary | ICD-10-CM | POA: Diagnosis not present

## 2022-09-17 DIAGNOSIS — I129 Hypertensive chronic kidney disease with stage 1 through stage 4 chronic kidney disease, or unspecified chronic kidney disease: Secondary | ICD-10-CM | POA: Insufficient documentation

## 2022-09-17 HISTORY — DX: Type 2 diabetes mellitus without complications: E11.9

## 2022-09-17 HISTORY — DX: Essential (primary) hypertension: I10

## 2022-09-17 LAB — CBC
HCT: 39.1 % (ref 36.0–46.0)
Hemoglobin: 12.2 g/dL (ref 12.0–15.0)
MCH: 29.4 pg (ref 26.0–34.0)
MCHC: 31.2 g/dL (ref 30.0–36.0)
MCV: 94.2 fL (ref 80.0–100.0)
Platelets: 294 10*3/uL (ref 150–400)
RBC: 4.15 MIL/uL (ref 3.87–5.11)
RDW: 14.6 % (ref 11.5–15.5)
WBC: 9.9 10*3/uL (ref 4.0–10.5)
nRBC: 0 % (ref 0.0–0.2)

## 2022-09-17 LAB — HEMOGLOBIN A1C
Hgb A1c MFr Bld: 6.4 % — ABNORMAL HIGH (ref 4.8–5.6)
Mean Plasma Glucose: 136.98 mg/dL

## 2022-09-17 LAB — TYPE AND SCREEN
ABO/RH(D): B POS
Antibody Screen: NEGATIVE

## 2022-09-17 LAB — GLUCOSE, CAPILLARY: Glucose-Capillary: 92 mg/dL (ref 70–99)

## 2022-09-17 LAB — BASIC METABOLIC PANEL
Anion gap: 8 (ref 5–15)
BUN: 21 mg/dL (ref 8–23)
CO2: 26 mmol/L (ref 22–32)
Calcium: 9.1 mg/dL (ref 8.9–10.3)
Chloride: 107 mmol/L (ref 98–111)
Creatinine, Ser: 1.49 mg/dL — ABNORMAL HIGH (ref 0.44–1.00)
GFR, Estimated: 38 mL/min — ABNORMAL LOW (ref >60)
Glucose, Bld: 94 mg/dL (ref 70–99)
Potassium: 3.9 mmol/L (ref 3.5–5.1)
Sodium: 141 mmol/L (ref 135–145)

## 2022-09-17 LAB — SURGICAL PCR SCREEN
MRSA, PCR: NEGATIVE
Staphylococcus aureus: NEGATIVE

## 2022-09-17 NOTE — Progress Notes (Addendum)
PCP - Dr. Suanne Marker Cardiologist - Dr. Valaria Good  PPM/ICD - Denies Device Orders - n/a Rep Notified - n/a  Chest x-ray - 03/10/2022 EKG - 09/17/2022 Stress Test - 01/23/2021 ECHO - 01/23/2021 Cardiac Cath - Denies  Sleep Study - Denies CPAP - n/a  Pt is DM2. She does not have a blood sugar meter at home and does not check her sugars. Her CBG at PAT appointment was 92. All she has had today was a bowl of Corn Pops Cereal with 2% milk and a swiss cake roll  Blood Thinner Instructions: n/a Aspirin Instructions: n/a  NPO after midnight  COVID TEST- n/a   Anesthesia review: Yes. Abnormal EKG.  Patient denies shortness of breath, fever, cough and chest pain at PAT appointment   All instructions explained to the patient, with a verbal understanding of the material. Patient agrees to go over the instructions while at home for a better understanding. Patient also instructed to self quarantine after being tested for COVID-19. The opportunity to ask questions was provided.

## 2022-09-18 ENCOUNTER — Other Ambulatory Visit (HOSPITAL_COMMUNITY): Payer: Self-pay

## 2022-09-18 NOTE — Progress Notes (Signed)
Anesthesia Chart Review:  Follows with cardiology at [facility] for history of hypertension hyperlipidemia.  She was evaluated February 2022 for complaint of chest pain.  Stress echo 01/23/2021 was negative for ischemia.  She was last seen by Dr. Donley Redder 07/11/2022 and noted to be doing well from cardiac standpoint, chest pain resolved.  1 year follow-up recommended.  Non-insulin-dependent DM2, well controlled, 6.4 on preop labs.  Follows with nephrology at [facility] for history of CKD 3.  Baseline creatinine ~1.4-1.5.  Preop labs reviewed, creatinine mildly elevated 1.49, otherwise WNL.  EKG 09/17/2022: NSR.  Rate 70.  Low voltage QRS.  Septal infarct, age undetermined.  No significant change since last tracing.  Stress echo 01/23/2021 (Care Everywhere): Summary: The patient achieved 88 % of maximum predicted heart rate.  The METS achieved was 4.60. Exercise capacity was poor.  The patient had resting hypertension and became exceedingly  hypertensive with stress.  The patient had no chest pain.   Normal left ventricular function at rest.  The estimated LV ejection fraction is 55-60% .  Normal left ventricular function and global wall motion with stress.  There was normal increase in global LV function post exercise   Negative exercise echocardiography for inducible ischemia at target  heart rate.  Negative stress ECG for inducible ischemia at target heart rate.    Jasmane, [place] [facility] Short Stay Center/Anesthesiology Phone [phone] 09/18/2022 10:45 AM

## 2022-09-18 NOTE — Anesthesia Preprocedure Evaluation (Addendum)
Anesthesia Evaluation  Patient identified by MRN, date of birth, ID band Patient awake    Reviewed: Allergy & Precautions, NPO status , Patient's Chart, lab work & pertinent test results, reviewed documented beta blocker date and time   History of Anesthesia Complications Negative for: history of anesthetic complications  Airway Mallampati: III  TM Distance: >3 FB Neck ROM: Full    Dental  (+) Dental Advisory Given, Chipped   Pulmonary neg pulmonary ROS   Pulmonary exam normal        Cardiovascular hypertension, Pt. on medications and Pt. on home beta blockers Normal cardiovascular exam     Neuro/Psych negative neurological ROS  negative psych ROS   GI/Hepatic negative GI ROS, Neg liver ROS,,,  Endo/Other  diabetes, Type 2, Oral Hypoglycemic Agents   Obesity   Renal/GU CRFRenal disease     Musculoskeletal negative musculoskeletal ROS (+)    Abdominal   Peds  Hematology negative hematology ROS (+)   Anesthesia Other Findings   Reproductive/Obstetrics                             Anesthesia Physical Anesthesia Plan  ASA: 3  Anesthesia Plan: General   Post-op Pain Management: Tylenol PO (pre-op)* and Ketamine IV*   Induction: Intravenous  PONV Risk Score and Plan: 3 and Treatment may vary due to age or medical condition, Ondansetron and Dexamethasone  Airway Management Planned: Oral ETT  Additional Equipment: None  Intra-op Plan:   Post-operative Plan: Extubation in OR  Informed Consent: I have reviewed the patients History and Physical, chart, labs and discussed the procedure including the risks, benefits and alternatives for the proposed anesthesia with the patient or authorized representative who has indicated his/her understanding and acceptance.     Dental advisory given  Plan Discussed with: CRNA and Anesthesiologist  Anesthesia Plan Comments:          Anesthesia Quick Evaluation

## 2022-09-22 ENCOUNTER — Ambulatory Visit (HOSPITAL_COMMUNITY): Payer: Medicare HMO | Admitting: Physician Assistant

## 2022-09-22 ENCOUNTER — Other Ambulatory Visit: Payer: Self-pay

## 2022-09-22 ENCOUNTER — Ambulatory Visit (HOSPITAL_BASED_OUTPATIENT_CLINIC_OR_DEPARTMENT_OTHER): Payer: Medicare HMO | Admitting: Physician Assistant

## 2022-09-22 ENCOUNTER — Encounter (HOSPITAL_COMMUNITY)
Admission: RE | Disposition: A | Discharge status: Home / Self Care | Payer: Self-pay | Source: Ambulatory Visit | Attending: Neurosurgery

## 2022-09-22 ENCOUNTER — Observation Stay (HOSPITAL_COMMUNITY)
Admission: RE | Admit: 2022-09-22 | Discharge: 2022-09-23 | Disposition: A | Discharge status: Home / Self Care | Payer: Medicare HMO | Source: Ambulatory Visit | Attending: Neurosurgery | Admitting: Neurosurgery

## 2022-09-22 ENCOUNTER — Encounter (HOSPITAL_COMMUNITY): Payer: Self-pay | Admitting: Neurosurgery

## 2022-09-22 ENCOUNTER — Ambulatory Visit (HOSPITAL_COMMUNITY): Payer: Medicare HMO

## 2022-09-22 DIAGNOSIS — E119 Type 2 diabetes mellitus without complications: Secondary | ICD-10-CM | POA: Diagnosis not present

## 2022-09-22 DIAGNOSIS — M47816 Spondylosis without myelopathy or radiculopathy, lumbar region: Secondary | ICD-10-CM | POA: Diagnosis not present

## 2022-09-22 DIAGNOSIS — E1122 Type 2 diabetes mellitus with diabetic chronic kidney disease: Secondary | ICD-10-CM | POA: Diagnosis not present

## 2022-09-22 DIAGNOSIS — Z7984 Long term (current) use of oral hypoglycemic drugs: Secondary | ICD-10-CM | POA: Diagnosis not present

## 2022-09-22 DIAGNOSIS — M4316 Spondylolisthesis, lumbar region: Secondary | ICD-10-CM | POA: Diagnosis not present

## 2022-09-22 DIAGNOSIS — N189 Chronic kidney disease, unspecified: Secondary | ICD-10-CM

## 2022-09-22 DIAGNOSIS — I10 Essential (primary) hypertension: Secondary | ICD-10-CM | POA: Diagnosis not present

## 2022-09-22 DIAGNOSIS — Z96653 Presence of artificial knee joint, bilateral: Secondary | ICD-10-CM | POA: Insufficient documentation

## 2022-09-22 DIAGNOSIS — M48061 Spinal stenosis, lumbar region without neurogenic claudication: Secondary | ICD-10-CM | POA: Diagnosis not present

## 2022-09-22 DIAGNOSIS — I129 Hypertensive chronic kidney disease with stage 1 through stage 4 chronic kidney disease, or unspecified chronic kidney disease: Secondary | ICD-10-CM

## 2022-09-22 DIAGNOSIS — Z79899 Other long term (current) drug therapy: Secondary | ICD-10-CM | POA: Diagnosis not present

## 2022-09-22 DIAGNOSIS — M4310 Spondylolisthesis, site unspecified: Secondary | ICD-10-CM | POA: Diagnosis present

## 2022-09-22 LAB — GLUCOSE, CAPILLARY
Glucose-Capillary: 168 mg/dL — ABNORMAL HIGH (ref 70–99)
Glucose-Capillary: 151 mg/dL — ABNORMAL HIGH (ref 70–99)
Glucose-Capillary: 190 mg/dL — ABNORMAL HIGH (ref 70–99)
Glucose-Capillary: 175 mg/dL — ABNORMAL HIGH (ref 70–99)
Glucose-Capillary: 187 mg/dL — ABNORMAL HIGH (ref 70–99)
Glucose-Capillary: 257 mg/dL — ABNORMAL HIGH (ref 70–99)
Glucose-Capillary: 257 mg/dL — ABNORMAL HIGH (ref 70–99)

## 2022-09-22 SURGERY — POSTERIOR LUMBAR FUSION 1 LEVEL
Anesthesia: General | Site: Back

## 2022-09-22 MED ORDER — ONDANSETRON HCL 4 MG/2ML IJ SOLN
4.0000 mg | Freq: Four times a day (QID) | INTRAMUSCULAR | Status: DC | PRN
  Administered 2022-09-22: 4 mg via INTRAVENOUS
  Filled 2022-09-22: qty 2

## 2022-09-22 MED ORDER — FENTANYL CITRATE (PF) 100 MCG/2ML IJ SOLN
INTRAMUSCULAR | Status: AC
  Filled 2022-09-22: qty 2

## 2022-09-22 MED ORDER — HYDROMORPHONE HCL 1 MG/ML IJ SOLN: 1.0000 mg | INTRAMUSCULAR | Status: DC | PRN

## 2022-09-22 MED ORDER — VANCOMYCIN HCL 1000 MG IV SOLR
INTRAVENOUS | Status: DC | PRN
  Administered 2022-09-22: 1000 mg

## 2022-09-22 MED ORDER — CEFAZOLIN SODIUM-DEXTROSE 2-4 GM/100ML-% IV SOLN
INTRAVENOUS | Status: AC
  Filled 2022-09-22: qty 100

## 2022-09-22 MED ORDER — CHLORHEXIDINE GLUCONATE CLOTH 2 % EX PADS: 6.0000 | MEDICATED_PAD | Freq: Once | CUTANEOUS | Status: DC

## 2022-09-22 MED ORDER — POLYETHYLENE GLYCOL 3350 17 G PO PACK: 17.0000 g | PACK | Freq: Every day | ORAL | Status: DC | PRN

## 2022-09-22 MED ORDER — OXYCODONE HCL 5 MG/5ML PO SOLN: 5.0000 mg | Freq: Once | ORAL | Status: AC | PRN

## 2022-09-22 MED ORDER — LIDOCAINE 2% (20 MG/ML) 5 ML SYRINGE
INTRAMUSCULAR | Status: DC | PRN
  Administered 2022-09-22: 80 mg via INTRAVENOUS

## 2022-09-22 MED ORDER — ACETAMINOPHEN 650 MG RE SUPP: 650.0000 mg | RECTAL | Status: DC | PRN

## 2022-09-22 MED ORDER — SODIUM CHLORIDE 0.9 % IV SOLN
250.0000 mL | INTRAVENOUS | Status: DC
  Administered 2022-09-22: 250 mL via INTRAVENOUS

## 2022-09-22 MED ORDER — ACETAMINOPHEN 500 MG PO TABS
1000.0000 mg | ORAL_TABLET | Freq: Once | ORAL | Status: AC
  Administered 2022-09-22: 1000 mg via ORAL
  Filled 2022-09-22: qty 2

## 2022-09-22 MED ORDER — PROPOFOL 10 MG/ML IV BOLUS
INTRAVENOUS | Status: AC
  Filled 2022-09-22: qty 20

## 2022-09-22 MED ORDER — SODIUM CHLORIDE 0.9% FLUSH: 3.0000 mL | INTRAVENOUS | Status: DC | PRN

## 2022-09-22 MED ORDER — ROCURONIUM BROMIDE 10 MG/ML (PF) SYRINGE
PREFILLED_SYRINGE | INTRAVENOUS | Status: AC
  Filled 2022-09-22: qty 10

## 2022-09-22 MED ORDER — INSULIN ASPART 100 UNIT/ML IJ SOLN
0.0000 [IU] | INTRAMUSCULAR | Status: AC | PRN
  Administered 2022-09-22: 2 [IU] via SUBCUTANEOUS
  Administered 2022-09-22: 4 [IU] via SUBCUTANEOUS
  Filled 2022-09-22: qty 1

## 2022-09-22 MED ORDER — ONDANSETRON HCL 4 MG/2ML IJ SOLN
INTRAMUSCULAR | Status: DC | PRN
  Administered 2022-09-22: 4 mg via INTRAVENOUS

## 2022-09-22 MED ORDER — KETAMINE HCL-SODIUM CHLORIDE 100-0.9 MG/10ML-% IV SOSY
PREFILLED_SYRINGE | INTRAVENOUS | Status: DC | PRN
  Administered 2022-09-22: 20 mg via INTRAVENOUS
  Administered 2022-09-22: 10 mg via INTRAVENOUS
  Administered 2022-09-22: 20 mg via INTRAVENOUS

## 2022-09-22 MED ORDER — CHLORHEXIDINE GLUCONATE 0.12 % MT SOLN
15.0000 mL | Freq: Once | OROMUCOSAL | Status: AC
  Administered 2022-09-22: 15 mL via OROMUCOSAL
  Filled 2022-09-22: qty 15

## 2022-09-22 MED ORDER — ATORVASTATIN CALCIUM 40 MG PO TABS
40.0000 mg | ORAL_TABLET | Freq: Every day | ORAL | Status: DC
  Administered 2022-09-22: 40 mg via ORAL
  Filled 2022-09-22 (×2): qty 1

## 2022-09-22 MED ORDER — PROPOFOL 10 MG/ML IV BOLUS
INTRAVENOUS | Status: DC | PRN
  Administered 2022-09-22: 150 mg via INTRAVENOUS

## 2022-09-22 MED ORDER — ACETAMINOPHEN 325 MG PO TABS: 650.0000 mg | ORAL_TABLET | ORAL | Status: DC | PRN

## 2022-09-22 MED ORDER — GABAPENTIN 300 MG PO CAPS
300.0000 mg | ORAL_CAPSULE | Freq: Every morning | ORAL | Status: DC
  Administered 2022-09-22 – 2022-09-23 (×2): 300 mg via ORAL
  Filled 2022-09-22 (×3): qty 1

## 2022-09-22 MED ORDER — MAGNESIUM OXIDE -MG SUPPLEMENT 400 (240 MG) MG PO TABS
400.0000 mg | ORAL_TABLET | Freq: Every day | ORAL | Status: DC
  Administered 2022-09-23: 400 mg via ORAL
  Filled 2022-09-22: qty 1

## 2022-09-22 MED ORDER — FENTANYL CITRATE (PF) 250 MCG/5ML IJ SOLN
INTRAMUSCULAR | Status: AC
  Filled 2022-09-22: qty 5

## 2022-09-22 MED ORDER — ROCURONIUM BROMIDE 10 MG/ML (PF) SYRINGE
PREFILLED_SYRINGE | INTRAVENOUS | Status: DC | PRN
  Administered 2022-09-22: 50 mg via INTRAVENOUS

## 2022-09-22 MED ORDER — SUCCINYLCHOLINE CHLORIDE 200 MG/10ML IV SOSY
PREFILLED_SYRINGE | INTRAVENOUS | Status: AC
  Filled 2022-09-22: qty 10

## 2022-09-22 MED ORDER — MIDAZOLAM HCL 5 MG/5ML IJ SOLN
INTRAMUSCULAR | Status: DC | PRN
  Administered 2022-09-22: 2 mg via INTRAVENOUS

## 2022-09-22 MED ORDER — THROMBIN 20000 UNITS EX SOLR
CUTANEOUS | Status: DC | PRN
  Administered 2022-09-22: 20 mL

## 2022-09-22 MED ORDER — PIOGLITAZONE HCL 15 MG PO TABS
15.0000 mg | ORAL_TABLET | Freq: Every day | ORAL | Status: DC
  Administered 2022-09-23: 15 mg via ORAL
  Filled 2022-09-22: qty 1

## 2022-09-22 MED ORDER — AMLODIPINE BESYLATE 5 MG PO TABS
10.0000 mg | ORAL_TABLET | Freq: Every morning | ORAL | Status: DC
  Administered 2022-09-23: 10 mg via ORAL
  Filled 2022-09-22: qty 1
  Filled 2022-09-22: qty 2

## 2022-09-22 MED ORDER — METFORMIN HCL ER 500 MG PO TB24
500.0000 mg | ORAL_TABLET | Freq: Every day | ORAL | Status: DC
  Administered 2022-09-23: 500 mg via ORAL
  Filled 2022-09-22: qty 1

## 2022-09-22 MED ORDER — FENTANYL CITRATE (PF) 250 MCG/5ML IJ SOLN
INTRAMUSCULAR | Status: DC | PRN
  Administered 2022-09-22: 100 ug via INTRAVENOUS
  Administered 2022-09-22: 50 ug via INTRAVENOUS

## 2022-09-22 MED ORDER — LACTATED RINGERS IV SOLN: INTRAVENOUS | Status: DC

## 2022-09-22 MED ORDER — FENTANYL CITRATE (PF) 100 MCG/2ML IJ SOLN
25.0000 ug | INTRAMUSCULAR | Status: DC | PRN
  Administered 2022-09-22 (×3): 50 ug via INTRAVENOUS

## 2022-09-22 MED ORDER — PHENOL 1.4 % MT LIQD: 1.0000 | OROMUCOSAL | Status: DC | PRN

## 2022-09-22 MED ORDER — CALCIUM CARBONATE 1250 (500 CA) MG PO TABS
1250.0000 mg | ORAL_TABLET | Freq: Every day | ORAL | Status: DC
  Administered 2022-09-23: 1250 mg via ORAL
  Filled 2022-09-22: qty 1

## 2022-09-22 MED ORDER — MIDAZOLAM HCL 2 MG/2ML IJ SOLN
INTRAMUSCULAR | Status: AC
  Filled 2022-09-22: qty 2

## 2022-09-22 MED ORDER — OXYCODONE HCL 5 MG PO TABS
ORAL_TABLET | ORAL | Status: AC
  Filled 2022-09-22: qty 1

## 2022-09-22 MED ORDER — ONDANSETRON HCL 4 MG/2ML IJ SOLN: 4.0000 mg | Freq: Once | INTRAMUSCULAR | Status: DC | PRN

## 2022-09-22 MED ORDER — BISACODYL 10 MG RE SUPP: 10.0000 mg | Freq: Every day | RECTAL | Status: DC | PRN

## 2022-09-22 MED ORDER — CHLORTHALIDONE 25 MG PO TABS
50.0000 mg | ORAL_TABLET | Freq: Every morning | ORAL | Status: DC
  Administered 2022-09-22 – 2022-09-23 (×2): 50 mg via ORAL
  Filled 2022-09-22: qty 2
  Filled 2022-09-22: qty 1
  Filled 2022-09-22: qty 2

## 2022-09-22 MED ORDER — VITAMIN D 25 MCG (1000 UNIT) PO TABS
1000.0000 [IU] | ORAL_TABLET | Freq: Every morning | ORAL | Status: DC
  Administered 2022-09-22 – 2022-09-23 (×2): 1000 [IU] via ORAL
  Filled 2022-09-22 (×3): qty 1

## 2022-09-22 MED ORDER — PHENYLEPHRINE 80 MCG/ML (10ML) SYRINGE FOR IV PUSH (FOR BLOOD PRESSURE SUPPORT)
PREFILLED_SYRINGE | INTRAVENOUS | Status: DC | PRN
  Administered 2022-09-22: 80 ug via INTRAVENOUS

## 2022-09-22 MED ORDER — VANCOMYCIN HCL 1000 MG IV SOLR
INTRAVENOUS | Status: AC
  Filled 2022-09-22: qty 20

## 2022-09-22 MED ORDER — CEFAZOLIN SODIUM-DEXTROSE 2-4 GM/100ML-% IV SOLN
2.0000 g | INTRAVENOUS | Status: AC
  Administered 2022-09-22: 2 g via INTRAVENOUS

## 2022-09-22 MED ORDER — HYDROMORPHONE HCL 1 MG/ML IJ SOLN
INTRAMUSCULAR | Status: AC
  Filled 2022-09-22: qty 1

## 2022-09-22 MED ORDER — ORAL CARE MOUTH RINSE: 15.0000 mL | Freq: Once | OROMUCOSAL | Status: AC

## 2022-09-22 MED ORDER — SODIUM CHLORIDE 0.9% FLUSH
3.0000 mL | Freq: Two times a day (BID) | INTRAVENOUS | Status: DC
  Administered 2022-09-22: 3 mL via INTRAVENOUS

## 2022-09-22 MED ORDER — BUPIVACAINE HCL (PF) 0.25 % IJ SOLN
INTRAMUSCULAR | Status: DC | PRN
  Administered 2022-09-22: 20 mL

## 2022-09-22 MED ORDER — DEXAMETHASONE SODIUM PHOSPHATE 10 MG/ML IJ SOLN
INTRAMUSCULAR | Status: AC
  Filled 2022-09-22: qty 1

## 2022-09-22 MED ORDER — DOXAZOSIN MESYLATE 1 MG PO TABS
1.0000 mg | ORAL_TABLET | Freq: Every day | ORAL | Status: DC
  Filled 2022-09-22 (×2): qty 1

## 2022-09-22 MED ORDER — 0.9 % SODIUM CHLORIDE (POUR BTL) OPTIME
TOPICAL | Status: DC | PRN
  Administered 2022-09-22: 1000 mL

## 2022-09-22 MED ORDER — FLEET ENEMA 7-19 GM/118ML RE ENEM: 1.0000 | ENEMA | Freq: Once | RECTAL | Status: DC | PRN

## 2022-09-22 MED ORDER — DIAZEPAM 5 MG PO TABS
5.0000 mg | ORAL_TABLET | Freq: Four times a day (QID) | ORAL | Status: DC | PRN
  Administered 2022-09-22 (×2): 5 mg via ORAL
  Filled 2022-09-22 (×2): qty 1

## 2022-09-22 MED ORDER — LIDOCAINE 2% (20 MG/ML) 5 ML SYRINGE
INTRAMUSCULAR | Status: AC
  Filled 2022-09-22: qty 5

## 2022-09-22 MED ORDER — HYDROMORPHONE HCL 1 MG/ML IJ SOLN
0.2500 mg | INTRAMUSCULAR | Status: DC | PRN
  Administered 2022-09-22 (×4): 0.5 mg via INTRAVENOUS

## 2022-09-22 MED ORDER — MENTHOL 3 MG MT LOZG: 1.0000 | LOZENGE | OROMUCOSAL | Status: DC | PRN

## 2022-09-22 MED ORDER — ONDANSETRON HCL 4 MG/2ML IJ SOLN
INTRAMUSCULAR | Status: AC
  Filled 2022-09-22: qty 2

## 2022-09-22 MED ORDER — HYDROCODONE-ACETAMINOPHEN 10-325 MG PO TABS: 1.0000 | ORAL_TABLET | ORAL | Status: DC | PRN

## 2022-09-22 MED ORDER — HYDROMORPHONE HCL 1 MG/ML IJ SOLN
INTRAMUSCULAR | Status: AC
  Administered 2022-09-22: 1 mg via INTRAVENOUS
  Filled 2022-09-22: qty 1

## 2022-09-22 MED ORDER — ONDANSETRON HCL 4 MG PO TABS: 4.0000 mg | ORAL_TABLET | Freq: Four times a day (QID) | ORAL | Status: DC | PRN

## 2022-09-22 MED ORDER — EPHEDRINE SULFATE-NACL 50-0.9 MG/10ML-% IV SOSY
PREFILLED_SYRINGE | INTRAVENOUS | Status: DC | PRN
  Administered 2022-09-22 (×2): 5 mg via INTRAVENOUS

## 2022-09-22 MED ORDER — OXYCODONE HCL 5 MG PO TABS
5.0000 mg | ORAL_TABLET | Freq: Once | ORAL | Status: AC | PRN
  Administered 2022-09-22: 5 mg via ORAL

## 2022-09-22 MED ORDER — CYANOCOBALAMIN 500 MCG PO TABS
2500.0000 ug | ORAL_TABLET | Freq: Every day | ORAL | Status: DC
  Filled 2022-09-22: qty 5

## 2022-09-22 MED ORDER — BUPIVACAINE HCL (PF) 0.25 % IJ SOLN
INTRAMUSCULAR | Status: AC
  Filled 2022-09-22: qty 30

## 2022-09-22 MED ORDER — SUGAMMADEX SODIUM 200 MG/2ML IV SOLN
INTRAVENOUS | Status: DC | PRN
  Administered 2022-09-22: 200 mg via INTRAVENOUS

## 2022-09-22 MED ORDER — KETAMINE HCL 50 MG/5ML IJ SOSY
PREFILLED_SYRINGE | INTRAMUSCULAR | Status: AC
  Filled 2022-09-22: qty 5

## 2022-09-22 MED ORDER — DEXAMETHASONE SODIUM PHOSPHATE 10 MG/ML IJ SOLN
INTRAMUSCULAR | Status: DC | PRN
  Administered 2022-09-22: 10 mg via INTRAVENOUS

## 2022-09-22 MED ORDER — CARVEDILOL 25 MG PO TABS
25.0000 mg | ORAL_TABLET | Freq: Two times a day (BID) | ORAL | Status: DC
  Administered 2022-09-22: 25 mg via ORAL
  Filled 2022-09-22 (×2): qty 1

## 2022-09-22 MED ORDER — CEFAZOLIN SODIUM-DEXTROSE 1-4 GM/50ML-% IV SOLN
1.0000 g | Freq: Three times a day (TID) | INTRAVENOUS | Status: AC
  Administered 2022-09-22 (×2): 1 g via INTRAVENOUS
  Filled 2022-09-22 (×3): qty 50

## 2022-09-22 MED ORDER — PHENYLEPHRINE HCL-NACL 20-0.9 MG/250ML-% IV SOLN
INTRAVENOUS | Status: DC | PRN
  Administered 2022-09-22: 15 ug/min via INTRAVENOUS

## 2022-09-22 MED ORDER — OXYCODONE HCL 5 MG PO TABS
10.0000 mg | ORAL_TABLET | ORAL | Status: DC | PRN
  Administered 2022-09-22 – 2022-09-23 (×5): 10 mg via ORAL
  Filled 2022-09-22 (×5): qty 2

## 2022-09-22 MED ORDER — GLIPIZIDE ER 5 MG PO TB24
5.0000 mg | ORAL_TABLET | Freq: Every day | ORAL | Status: DC
  Administered 2022-09-23: 5 mg via ORAL
  Filled 2022-09-22: qty 1

## 2022-09-22 MED ORDER — THROMBIN 20000 UNITS EX SOLR
CUTANEOUS | Status: AC
  Filled 2022-09-22: qty 20000

## 2022-09-22 SURGICAL SUPPLY — 52 items
BAG COUNTER SPONGE SURGICOUNT (BAG) ×1 IMPLANT
BENZOIN TINCTURE PRP APPL 2/3 (GAUZE/BANDAGES/DRESSINGS) IMPLANT
BLADE BONE MILL MEDIUM (MISCELLANEOUS) ×1 IMPLANT
BLADE CLIPPER SURG (BLADE) IMPLANT
BUR CUTTER 7.0 ROUND (BURR) IMPLANT
BUR MATCHSTICK NEURO 3.0 LAGG (BURR) ×1 IMPLANT
CAGE EXP CATALYFT 9 (Plate) IMPLANT
CANISTER SUCT 3000ML PPV (MISCELLANEOUS) ×1 IMPLANT
CAP LCK SPNE (Orthopedic Implant) ×4 IMPLANT
CAP LOCK SPINE RADIUS (Orthopedic Implant) IMPLANT
CAP LOCKING (Orthopedic Implant) ×4 IMPLANT
CNTNR URN SCR LID CUP LEK RST (MISCELLANEOUS) ×1 IMPLANT
CONT SPEC 4OZ STRL OR WHT (MISCELLANEOUS) ×1
COVER BACK TABLE 60X90IN (DRAPES) ×1 IMPLANT
DERMABOND ADVANCED .7 DNX12 (GAUZE/BANDAGES/DRESSINGS) ×1 IMPLANT
DRAPE C-ARM 42X72 X-RAY (DRAPES) ×2 IMPLANT
DRAPE HALF SHEET 40X57 (DRAPES) IMPLANT
DRAPE LAPAROTOMY 100X72X124 (DRAPES) ×1 IMPLANT
DRAPE SURG 17X23 STRL (DRAPES) ×4 IMPLANT
DRSG OPSITE POSTOP 4X6 (GAUZE/BANDAGES/DRESSINGS) ×1 IMPLANT
DURAPREP 26ML APPLICATOR (WOUND CARE) ×1 IMPLANT
ELECT REM PT RETURN 9FT ADLT (ELECTROSURGICAL) ×1
ELECTRODE REM PT RTRN 9FT ADLT (ELECTROSURGICAL) ×1 IMPLANT
EVACUATOR 1/8 PVC DRAIN (DRAIN) IMPLANT
GAUZE 4X4 16PLY ~~LOC~~+RFID DBL (SPONGE) IMPLANT
GAUZE SPONGE 4X4 12PLY STRL (GAUZE/BANDAGES/DRESSINGS) IMPLANT
GLOVE BIO SURGEON STRL SZ 6.5 (GLOVE) ×1 IMPLANT
GLOVE BIOGEL PI IND STRL 6.5 (GLOVE) ×1 IMPLANT
GLOVE ECLIPSE 9.0 STRL (GLOVE) ×2 IMPLANT
GLOVE EXAM NITRILE XL STR (GLOVE) IMPLANT
GOWN STRL REUS W/ TWL LRG LVL3 (GOWN DISPOSABLE) IMPLANT
GOWN STRL REUS W/ TWL XL LVL3 (GOWN DISPOSABLE) ×2 IMPLANT
GOWN STRL REUS W/TWL 2XL LVL3 (GOWN DISPOSABLE) IMPLANT
GOWN STRL REUS W/TWL LRG LVL3 (GOWN DISPOSABLE)
GOWN STRL REUS W/TWL XL LVL3 (GOWN DISPOSABLE) ×2
KIT BASIN OR (CUSTOM PROCEDURE TRAY) ×1 IMPLANT
KIT TURNOVER KIT B (KITS) ×1 IMPLANT
MILL MEDIUM DISP (BLADE) ×1 IMPLANT
NEEDLE HYPO 22GX1.5 SAFETY (NEEDLE) ×1 IMPLANT
NS IRRIG 1000ML POUR BTL (IV SOLUTION) ×1 IMPLANT
PACK LAMINECTOMY NEURO (CUSTOM PROCEDURE TRAY) ×1 IMPLANT
PUTTY GRAFTON DBF 6CC W/DELIVE (Putty) IMPLANT
ROD RADIUS 35MM (Rod) IMPLANT
SCREW 5.75X45MM (Screw) IMPLANT
SPONGE SURGIFOAM ABS GEL 100 (HEMOSTASIS) ×1 IMPLANT
STRIP CLOSURE SKIN 1/2X4 (GAUZE/BANDAGES/DRESSINGS) ×2 IMPLANT
SUT VIC AB 2-0 CT1 18 (SUTURE) ×1 IMPLANT
SUT VIC AB 3-0 SH 8-18 (SUTURE) ×2 IMPLANT
TOWEL GREEN STERILE (TOWEL DISPOSABLE) ×1 IMPLANT
TOWEL GREEN STERILE FF (TOWEL DISPOSABLE) ×1 IMPLANT
TRAY FOLEY MTR SLVR 16FR STAT (SET/KITS/TRAYS/PACK) ×1 IMPLANT
WATER STERILE IRR 1000ML POUR (IV SOLUTION) ×1 IMPLANT

## 2022-09-22 NOTE — Anesthesia Postprocedure Evaluation (Signed)
Anesthesia Post Note  Patient: Brandi Frost  Procedure(s) Performed: Posterior Lumbar Interbody Fusion  - Lumbar four-Lumbar five (Back)     Patient location during evaluation: PACU Anesthesia Type: General Level of consciousness: awake and alert Pain management: pain level controlled Vital Signs Assessment: post-procedure vital signs reviewed and stable Respiratory status: spontaneous breathing, nonlabored ventilation and respiratory function stable Cardiovascular status: stable and blood pressure returned to baseline Anesthetic complications: no   No notable events documented.  Last Vitals:  Vitals:   09/22/22 1200 09/22/22 1231  BP: 108/68 (!) 118/57  Pulse: 67 69  Resp: 12 18  Temp: 36.7 C 36.6 C  SpO2: 92% 94%    Last Pain:  Vitals:   09/22/22 1153  TempSrc:   PainSc: [place] [facility]

## 2022-09-22 NOTE — Brief Op Note (Signed)
09/22/2022  10:20 AM  PATIENT:  Brandi Frost  66 y.o. female  PRE-OPERATIVE DIAGNOSIS:  Spondylolisthesis  POST-OPERATIVE DIAGNOSIS:  Spondylolisthesis  PROCEDURE:  Procedure(s): Posterior Lumbar Interbody Fusion  - Lumbar four-Lumbar five (N/A)  SURGEON:  Surgeon(s) and Role:    Earnie Larsson, MD - Primary  PHYSICIAN ASSISTANT:   ASSISTANTSMearl Latin   ANESTHESIA:   general  EBL:  150 mL   BLOOD ADMINISTERED:none  DRAINS: none   LOCAL MEDICATIONS USED:  MARCAINE     SPECIMEN:  No Specimen  DISPOSITION OF SPECIMEN:  N/A  COUNTS:  YES  TOURNIQUET:  * No tourniquets in log *  DICTATION: .Dragon Dictation  PLAN OF CARE: Admit for overnight observation  PATIENT DISPOSITION:  PACU - hemodynamically stable.   Delay start of Pharmacological VTE agent (>24hrs) due to surgical blood loss or risk of bleeding: yes

## 2022-09-22 NOTE — Anesthesia Procedure Notes (Signed)
Procedure Name: Intubation Date/Time: 09/22/2022 8:11 AM  Performed by: Lowella Dell, CRNAPre-anesthesia Checklist: Patient identified, Emergency Drugs available, Suction available and Patient being monitored Patient Re-evaluated:Patient Re-evaluated prior to induction Oxygen Delivery Method: Circle System Utilized Preoxygenation: Pre-oxygenation with 100% oxygen Induction Type: IV induction Ventilation: Mask ventilation without difficulty and Oral airway inserted - appropriate to patient size Laryngoscope Size: Glidescope and 3 (*elective glidescope d/t prev. neck surgery) Grade View: Grade II Tube type: Oral Tube size: 7.0 mm Number of attempts: 1 Airway Equipment and Method: Rigid stylet Placement Confirmation: ETT inserted through vocal cords under direct vision, positive ETCO2 and breath sounds checked- equal and bilateral Secured at: 20 cm Tube secured with: Tape Dental Injury: Teeth and Oropharynx as per pre-operative assessment

## 2022-09-22 NOTE — Op Note (Signed)
Date of procedure: 09/22/2022  Date of dictation: Same  Service: Neurosurgery  Preoperative diagnosis: Grade 1 L4-5 unstable degenerative spondylolisthesis with stenosis  Postoperative diagnosis: Same  Procedure Name: Bilateral L4-5 decompressive laminotomies with foraminotomies, more than would be required for simple interbody fusion alone.  L4-5 posterior lumbar interbody fusion utilizing interbody cages, locally harvested autograft, and morselized allograft  L4-5 posterior lumbar lateral arthrodesis utilizing nonsegmental pedicle screw fixation and local autografting.  Surgeon:Taquana Bartley A.Gael Londo, M.D.  Asst. Surgeon: Reinaldo Meeker, NP  Anesthesia: General  Indication: 66 year old female with severe back and bilateral lower extremity symptoms which are progressively worsened.  Work-up demonstrates evidence of severe disc degeneration with degenerative spondylolisthesis and marked facet arthropathy with severe stenosis at L4-5 and some element of tympanic instability with flexion.  Patient has failed conservative management presents now for decompression and fusion L4-5 in hopes of improving her symptoms.  Operative note: After induction of anesthesia, patient position prone on the Wilson frame and properly padded.  Lumbar region prepped and draped sterilely.  Incision made overlying L4-5.  Dissection performed bilaterally.  Retractor placed.  Fluoroscopy used.  Levels confirmed.  Decompressive laminotomy was then performed using Leksell rongeurs, Kerrison rongeurs and high-speed drill to remove the entire lamina of L4, the entire inferior facet of L4 bilaterally, the majority of the superior facet of L5 bilaterally and the superior aspect the L5 lamina.  Ligament flavum elevated and resected.  Foraminotomies completed along the course the exiting L4 and L5 nerve roots.  The ligamentum flavum between L3 and L4 was further resected and dorsal epidural fat which caused some stenosis on the MRI scan was  also removed at this level.  Bilateral discectomy was then performed at L4-5.  The space then prepared for interbody fusion.  With a distractor placed patient's right side.  Soft tissue removed from the interspace.  A 9 mm [employer] expandable cage was then impacted in place on the left side and expanded.  Distractor removed patient's right side.  Disc space prepared on the right side.  Morselized autograft packed into the interspace.  Second cage was then packed in the place and expanded.  Pedicles of L4 and L5 were identified using surface landmarks and intraoperative fluoroscopy.  Superficial bone around the pedicle was then removed using high-speed drill.  Pedicle was then probed using a pedicle awl.  Each pedicle all track was probed and found to be solidly within the bone.  Each pedicle all track was then tapped with a screw tap.  Screw temple was probed and found to be solidly within the bone.  5.75 mm radius brand screws from [employer] placed bilaterally at L4 and L5.  Final images reveal good position of the cages and the hardware at the proper upper level with normal alignment of the spine.  Transverse processes of L4 and L5 were decorticated.  Morselized autograft was packed posterior laterally.  Short segment titanium rod placed over the screw heads at L4 and L5.  Locking caps placed over the screws.  Locking caps then engaged with construct under mild compression.  Each cage was then further packed with graft on demineralized bone fibrils.  Gelfoam was placed over the laminectomy defect.  Vancomycin powder was placed in the deep wound space.  Wounds then closed in layers with Vicryl sutures.  Steri-Strips and sterile dressing were applied.  No apparent complications.  Patient tolerated the procedure well and she returns to the recovery room postop.

## 2022-09-22 NOTE — Transfer of Care (Signed)
Immediate Anesthesia Transfer of Care Note  Patient: Brandi Frost  Procedure(s) Performed: Posterior Lumbar Interbody Fusion  - Lumbar four-Lumbar five (Back)  Patient Location: PACU  Anesthesia Type:General  Level of Consciousness: awake and alert   Airway & Oxygen Therapy: Patient Spontanous Breathing and Patient connected to face mask oxygen  Post-op Assessment: Report given to RN, Post -op Vital signs reviewed and stable, and Patient moving all extremities X 4  Post vital signs: Reviewed and stable  Last Vitals:  Vitals Value Taken Time  BP 119/60 09/22/22 1031  Temp    Pulse 69 09/22/22 1033  Resp 22   SpO2 93 % 09/22/22 1033  Vitals shown include unvalidated device data.  Last Pain:  Vitals:   09/22/22 0713  TempSrc:   PainSc: 7       Patients Stated Pain Goal: 0 (67/54/49 2010)  Complications: No notable events documented.

## 2022-09-22 NOTE — H&P (Signed)
Brandi Frost is an 66 y.o. female.   Chief Complaint: Back pain HPI: 66 year old female with chronic and progressively worsening lumbar pain with bilateral lower extremity symptoms consistent with neurogenic claudication and radiculopathy which is failed conservative manage.  Work-up demonstrates evidence of progressive disc generation with an unstable degenerative spondylolisthesis at L4-5 with severe stenosis.  Patient with postoperative change at L5-S1 without evidence of significant stenosis.  Patient with moderate multifactorial stenosis primarily secondary to epidural fat at L3-4 and L2-3.  Patient presents now for L4-5 decompression and fusion in hopes of improving her symptoms.  Past Medical History:  Diagnosis Date   Diabetes mellitus without complication ([place])    Hypertension     Past Surgical History:  Procedure Laterality Date   ABDOMINAL HYSTERECTOMY  1997   ANKLE HARDWARE REMOVAL     BACK SURGERY     x2   CARPAL TUNNEL RELEASE Right    CHOLECYSTECTOMY  2018   COLONOSCOPY  2022   COLONOSCOPY WITH ESOPHAGOGASTRODUODENOSCOPY (EGD)     FRACTURE SURGERY Right    ankle   REPLACEMENT TOTAL KNEE Bilateral    TUBAL LIGATION  1995    History reviewed. No pertinent family history. Social History:  reports that she has never smoked. She has never used smokeless tobacco. She reports that she does not currently use alcohol. She reports that she does not use drugs.  Allergies: No Known Allergies  Medications Prior to Admission  Medication Sig Dispense Refill   amLODipine (NORVASC) 10 MG tablet Take 10 mg by mouth in the morning.     atorvastatin (LIPITOR) 40 MG tablet Take 40 mg by mouth in the morning.     calcium carbonate (OSCAL) 1500 (600 Ca) MG TABS tablet Take 600 mg by mouth in the morning.     carvedilol (COREG) 25 MG tablet Take 25 mg by mouth 2 (two) times daily.     chlorthalidone (HYGROTON) 50 MG tablet Take 50 mg by mouth in the morning.     cholecalciferol  (VITAMIN D3) 25 MCG (1000 UNIT) tablet Take 1,000 Units by mouth in the morning.     Cyanocobalamin (QC VITAMIN B12) 5000 MCG SUBL Take 2,500 mcg by mouth in the morning.     doxazosin (CARDURA) 1 MG tablet Take 1 mg by mouth in the morning.     gabapentin (NEURONTIN) 300 MG capsule Take 300 mg by mouth in the morning.     glipiZIDE (GLUCOTROL XL) 5 MG 24 hr tablet Take 5 mg by mouth in the morning.     HYDROcodone-acetaminophen (NORCO) 7.5-325 MG tablet Take 1 tablet by mouth every 6 (six) hours as needed for pain.     MAGNESIUM-OXIDE 400 (240 Mg) MG tablet Take 400 mg by mouth in the morning.     metFORMIN (GLUCOPHAGE-XR) 500 MG 24 hr tablet Take 500 mg by mouth in the morning.     pioglitazone (ACTOS) 15 MG tablet Take 15 mg by mouth in the morning.     tiZANidine (ZANAFLEX) 4 MG tablet Take 4 mg by mouth at bedtime.      Results for orders placed or performed during the hospital encounter of 09/22/22 (from the past 48 hour(s))  Glucose, capillary     Status: Abnormal   Collection Time: 09/22/22  7:27 AM  Result Value Ref Range   Glucose-Capillary 151 (H) 70 - 99 mg/dL    Comment: Glucose reference range applies only to samples taken after fasting for at least 8 hours.  No results found.  Pertinent items noted in HPI and remainder of comprehensive ROS otherwise negative.  Blood pressure (!) 148/73, pulse 68, temperature 98.6 F (37 C), temperature source Oral, resp. rate 18, height 5\' 5"  (1.651 m), weight 104.8 kg, SpO2 96 %.  Patient is awake and alert.  She is oriented and appropriate.  Speech is fluent.  Judgment insight intact.  Cranial nerve function normal bilaterally motor examination reveals intact motor strength bilaterally sensory examination decrease sensation to very light touch in both distal lower extremities.  Reflexes are hypoactive but symmetric.  No evidence of long track signs.  Gait is antalgic.  Posture is mildly flexed.  Examination head ears eyes nose and throat  is unremarked.  Chest and abdomen are obese but otherwise benign.  Extremities are free from injury or deformity. Assessment/Plan L4-5 unstable degenerative spondylolisthesis with severe stenosis.  Plan bilateral L4-5 decompressive laminotomies and foraminotomies followed by posterior lumbar to my fusion utilizing interbody cages, AUTOGRAFT, AND AUGMENTED WITH POSTERIOR ARTHRODESIS UTILIZING NONSEGMENTAL PEDICLE SCREW FIXATION AND LOCAL AUTOGRAFTING.  RISKS AND BENEFITS BEEN EXPLAINED.  PATIENT WISHES TO PROCEED.  Mallie Mussel A Nariya Neumeyer 09/22/2022, 7:49 AM

## 2022-09-23 DIAGNOSIS — M4316 Spondylolisthesis, lumbar region: Secondary | ICD-10-CM | POA: Diagnosis not present

## 2022-09-23 LAB — GLUCOSE, CAPILLARY: Glucose-Capillary: 223 mg/dL — ABNORMAL HIGH (ref 70–99)

## 2022-09-23 MED ORDER — TIZANIDINE HCL 4 MG PO TABS: 4.0000 mg | ORAL_TABLET | Freq: Every day | ORAL | 0 refills | Status: AC

## 2022-09-23 MED ORDER — OXYCODONE HCL 10 MG PO TABS: 10.0000 mg | ORAL_TABLET | ORAL | 0 refills | Status: AC | PRN

## 2022-09-23 NOTE — Evaluation (Signed)
Occupational Therapy Evaluation Patient Details Name: Brandi Frost MRN: [id number] DOB: 1956-02-20 Today's Date: 09/23/2022   History of Present Illness 66 yo female s/p PLIF L4-5 PMH DM, HTN, back surgery x2, R ankle surgery, BIL TKR,   Clinical Impression   Patient evaluated by Occupational Therapy with no further acute OT needs identified. All education has been completed and the patient has no further questions. See below for any follow-up Occupational Therapy or equipment needs. OT to sign off. Thank you for referral.        Recommendations for follow up therapy are one component of a multi-disciplinary discharge planning process, led by the attending physician.  Recommendations may be updated based on patient status, additional functional criteria and insurance authorization.   Follow Up Recommendations  No OT follow up    Assistance Recommended at Discharge PRN  Patient can return home with the following Assist for transportation;A little help with bathing/dressing/bathroom    Functional Status Assessment  Patient has had a recent decline in their functional status and demonstrates the ability to make significant improvements in function in a reasonable and predictable amount of time.  Equipment Recommendations  Other (comment) (RW)    Recommendations for Other Services       Precautions / Restrictions Precautions Precautions: Fall Precaution Comments: back handout provided and reviewed for adsl Required Braces or Orthoses: Spinal Brace Spinal Brace: Lumbar corset;Applied in sitting position      Mobility Bed Mobility Overal bed mobility: Needs Assistance Bed Mobility: Supine to Sit, Rolling Rolling: Supervision   Supine to sit: Supervision     General bed mobility comments: needs education on safety and practiced exiting on L side without rail to simulate home    Transfers Overall transfer level: Needs assistance Equipment used: Rolling walker (2  wheels) Transfers: Sit to/from Stand Sit to Stand: Min guard           General transfer comment: cues for hand placement and x2 attempts to stand      Balance Overall balance assessment: Mild deficits observed, not formally tested                                         ADL either performed or assessed with clinical judgement   ADL Overall ADL's : Needs assistance/impaired Eating/Feeding: Independent   Grooming: Wash/dry face;Modified independent;Standing (cueing for precautions)     Upper Body Bathing Details (indicate cue type and reason): discussed washing around the incision     Upper Body Dressing : Modified independent   Lower Body Dressing: Minimal assistance;Sit to/from stand;Adhering to back precautions Lower Body Dressing Details (indicate cue type and reason): bending and thinks she has a reacher at home. pt said she talk to her spouse about it. Also reports he can help. Reinforced the importance of back safety for healing Toilet Transfer: Supervision/safety;Ambulation;Regular Toilet;Rolling walker (2 wheels)           Functional mobility during ADLs: Supervision/safety;Rolling walker (2 wheels) General ADL Comments: educated on recommendation for rw for safety and pain management. Education for safety with RW  Back handout provided and reviewed adls in detail. Pt educated on: clothing between brace, never sleep in brace, set an alarm at night for medication, avoid sitting for long periods of time, correct bed positioning for sleeping, correct sequence for bed mobility, avoiding lifting more than 5 pounds and never wash directly over  incision. All education is complete and patient indicates understanding.    Vision Baseline Vision/History: 0 No visual deficits Vision Assessment?: No apparent visual deficits     Perception     Praxis      Pertinent Vitals/Pain Pain Assessment Pain Assessment: 0-10 Pain Score: 4  Pain Location: back  surgerical Pain Descriptors / Indicators: Grimacing, Guarding, Discomfort Pain Intervention(s): Monitored during session, Repositioned, RN gave pain meds during session, Limited activity within patient's tolerance     Hand Dominance Right   Extremity/Trunk Assessment Upper Extremity Assessment Upper Extremity Assessment: Overall WFL for tasks assessed   Lower Extremity Assessment Lower Extremity Assessment: Defer to PT evaluation   Cervical / Trunk Assessment Cervical / Trunk Assessment: Back Surgery   Communication Communication Communication: No difficulties   Cognition Arousal/Alertness: Awake/alert Behavior During Therapy: WFL for tasks assessed/performed Overall Cognitive Status: Impaired/Different from baseline Area of Impairment: Memory                     Memory: Decreased recall of precautions         General Comments: needs reminders for back precautions and poor recall of precautions     General Comments  incision dry and intact at this time.    Exercises     Shoulder Instructions      Home Living Family/patient expects to be discharged to:: Private residence Living Arrangements: Spouse/significant other Available Help at Discharge: Family;Available 24 hours/day Type of Home: Mobile home Home Access: Stairs to enter Entrance Stairs-Number of Steps: 5 Entrance Stairs-Rails: Can reach both Home Layout: One level     Bathroom Shower/Tub: Chief Strategy Officer: Standard     Home Equipment: Shower seat (describes a seat that does not sound to be dme shower seat but is being utilized in that way)   Additional Comments: reports that she drives 1 hour to parents home to help them for appointments but currently has additional family (A)ing with them. She has a spouse that can give 24/7 (A). 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: None Help from another person toileting, which includes using toliet, bedpan, or urinal?: None Help from another person bathing (including washing, rinsing, drying)?: A Little Help from another person to put on and taking off regular upper body clothing?: None Help from another person to put on and taking off regular lower body clothing?: A Little 6 Click Score: 22   End of Session Equipment Utilized During Treatment: Rolling walker (2 wheels);Back brace Nurse Communication: Mobility status;Precautions  Activity Tolerance: Patient tolerated treatment well Patient left: in chair;with call bell/phone within reach;with nursing/sitter in room  OT Visit Diagnosis: Unsteadiness on  feet (R26.81)                Time: 6269-4854 OT Time Calculation (min): 18 min Charges:  OT General Charges $OT Visit: 1 Visit OT Evaluation $OT Eval Moderate Complexity: 1 Mod   Brynn, OTR/L  Acute Rehabilitation Services Office: [phone] .   Mateo Flow 09/23/2022, 9:07 AM

## 2022-09-23 NOTE — Plan of Care (Signed)
  Problem: Skin Integrity: Goal: Risk for impaired skin integrity will decrease Outcome: Completed/Met   Problem: Education: Goal: Ability to verbalize activity precautions or restrictions will improve Outcome: Completed/Met Goal: Knowledge of the prescribed therapeutic regimen will improve Outcome: Completed/Met Goal: Understanding of discharge needs will improve Outcome: Completed/Met   Problem: Activity: Goal: Ability to avoid complications of mobility impairment will improve Outcome: Completed/Met Goal: Ability to tolerate increased activity will improve Outcome: Completed/Met Goal: Will remain free from falls Outcome: Completed/Met   Problem: Bowel/Gastric: Goal: Gastrointestinal status for postoperative course will improve Outcome: Completed/Met   Problem: Clinical Measurements: Goal: Ability to maintain clinical measurements within normal limits will improve Outcome: Completed/Met Goal: Postoperative complications will be avoided or minimized Outcome: Completed/Met Goal: Diagnostic test results will improve Outcome: Completed/Met   Problem: Pain Management: Goal: Pain level will decrease Outcome: Completed/Met   Problem: Skin Integrity: Goal: Will show signs of wound healing Outcome: Completed/Met   Problem: Health Behavior/Discharge Planning: Goal: Identification of resources available to assist in meeting health care needs will improve Outcome: Completed/Met   Problem: Bladder/Genitourinary: Goal: Urinary functional status for postoperative course will improve Outcome: Completed/Met

## 2022-09-23 NOTE — Progress Notes (Signed)
Physical Therapy Evaluation and Discharge Patient Details Name: Brandi Frost MRN: [id number] DOB: 18-Sep-1956 Today's Date: 09/23/2022  History of Present Illness  66 yo female s/p PLIF L4-5 PMH DM, HTN, back surgery x2, R ankle surgery, BIL TKR,  Clinical Impression  Patient evaluated by Physical Therapy with no further acute PT needs identified. All education has been completed and the patient has no further questions.  See below for any follow-up Physical Therapy or equipment needs. PT is signing off. Thank you for this referral.        Recommendations for follow up therapy are one component of a multi-disciplinary discharge planning process, led by the attending physician.  Recommendations may be updated based on patient status, additional functional criteria and insurance authorization.  Follow Up Recommendations No PT follow up      Assistance Recommended at Discharge PRN  Patient can return home with the following  A little help with walking and/or transfers;Assistance with cooking/housework;Help with stairs or ramp for entrance    Equipment Recommendations Rolling walker (2 wheels)  Recommendations for Other Services       Functional Status Assessment Patient has had a recent decline in their functional status and demonstrates the ability to make significant improvements in function in a reasonable and predictable amount of time.     Precautions / Restrictions Precautions Precautions: Fall Precaution Comments: back handout provided and reviewed precautions related to mobility Required Braces or Orthoses: Spinal Brace Spinal Brace: Lumbar corset;Applied in sitting position      Mobility  Bed Mobility Overal bed mobility: Needs Assistance Bed Mobility: Sit to Sidelying         Sit to sidelying: Modified independent (Device/Increase time) General bed mobility comments: return to bed with HOB flat and no rail; no cues needed    Transfers Overall transfer  level: Needs assistance Equipment used: Rolling walker (2 wheels) Transfers: Sit to/from Stand Sit to Stand: Supervision           General transfer comment: recalled hand placement for sit to stand; needed cues for stand to sit    Ambulation/Gait Ambulation/Gait assistance: Supervision Gait Distance (Feet): 140 Feet Assistive device: Rolling walker (2 wheels) Gait Pattern/deviations: Step-through pattern, Decreased stride length   Gait velocity interpretation: >2.62 ft/sec, indicative of community ambulatory   General Gait Details: initial cues for proximity to RW and upright posture; educated on continuously rolling walker as she continued her gait (not stopping between steps); pt able to return demonstrate with no further cues needed; pt demonstrated independently turning RW sideways to walk through narrow space in her room  Stairs Stairs: Yes Stairs assistance: Min guard Stair Management: Two rails, Step to pattern, Forwards Number of Stairs: 5 General stair comments: cues to ascending with "better" leg and to descend with "worse" leg for safety  Wheelchair Mobility    Modified Rankin (Stroke Patients Only)       Balance Overall balance assessment: Mild deficits observed, not formally tested                                           Pertinent Vitals/Pain Pain Assessment Pain Assessment: 0-10 Pain Score: 4  Pain Location: back surgerical Pain Descriptors / Indicators: Grimacing, Guarding, Discomfort Pain Intervention(s): Limited activity within patient's tolerance, Monitored during session, Premedicated before session, Repositioned    Home Living Family/patient expects to be discharged to:: Private residence  Living Arrangements: Spouse/significant other Available Help at Discharge: Family;Available 24 hours/day Type of Home: Mobile home Home Access: Stairs to enter Entrance Stairs-Rails: Can reach both Entrance Stairs-Number of Steps: 5    Home Layout: One level Home Equipment: Shower seat (describes a seat that does not sound to be dme shower seat but is being utilized in that way) Additional Comments: reports that she drives 1 hour to parents home to help them for appointments but currently has additional family (A)ing with them. She has a spouse that can give 24/7 (A). reports that she loves to yard sale find Investment banker, operational)    Prior Function Prior Level of Function : Independent/Modified Independent;Driving             Mobility Comments: uses Physiological scientist in grocery stores ADLs Comments: reports needing to sit to shower since May 2023     Hand Dominance   Dominant Hand: Right    Extremity/Trunk Assessment   Upper Extremity Assessment Upper Extremity Assessment: Overall WFL for tasks assessed    Lower Extremity Assessment Lower Extremity Assessment: Overall WFL for tasks assessed    Cervical / Trunk Assessment Cervical / Trunk Assessment: Back Surgery  Communication   Communication: No difficulties  Cognition Arousal/Alertness: Awake/alert Behavior During Therapy: WFL for tasks assessed/performed Overall Cognitive Status: Impaired/Different from baseline Area of Impairment: Memory                     Memory: Decreased recall of precautions         General Comments: able to verbalize precautionss but needs reminders to implement back precautions        General Comments General comments (skin integrity, edema, etc.): incision dry and intact at this time.    Exercises     Assessment/Plan    PT Assessment Patient does not need any further PT services  PT Problem List         PT Treatment Interventions      PT Goals (Current goals can be found in the Care Plan section)  Acute Rehab PT Goals Patient Stated Goal: go home today PT Goal Formulation: All assessment and education complete, DC therapy    Frequency       Co-evaluation               AM-PAC PT "6 Clicks"  Mobility  Outcome Measure Help needed turning from your back to your side while in a flat bed without using bedrails?: None Help needed moving from lying on your back to sitting on the side of a flat bed without using bedrails?: None Help needed moving to and from a bed to a chair (including a wheelchair)?: A Little Help needed standing up from a chair using your arms (e.g., wheelchair or bedside chair)?: A Little Help needed to walk in hospital room?: A Little Help needed climbing 3-5 steps with a railing? : A Little 6 Click Score: 20    End of Session Equipment Utilized During Treatment: Gait belt;Back brace Activity Tolerance: Patient tolerated treatment well Patient left: in bed;with call bell/phone within reach Nurse Communication: Mobility status;Other (comment) (needs RW for discharge) PT Visit Diagnosis: Other abnormalities of gait and mobility (R26.89)    Time: 8101-7510 PT Time Calculation (min) (ACUTE ONLY): 16 min   Charges:   PT Evaluation $PT Eval Low Complexity: [place], PT Acute Rehabilitation Services  Office [phone]   Rexanne Mano  09/23/2022, 9:19 AM

## 2022-09-23 NOTE — Progress Notes (Signed)
Patient alert and oriented, pt. Void, surgical site dry no sign of infection. D/c instruction explain and given to the patient, all questions answer. Rolling walker given to the patient per order.

## 2022-09-23 NOTE — Discharge Summary (Signed)
Physician Discharge Summary  Patient ID: Brandi Frost MRN: [id number] DOB/AGE: [birth year] 67 y.o.  Admit date: 09/22/2022 Discharge date: 09/23/2022  Admission Diagnoses:  Discharge Diagnoses:  Principal Problem:   Degenerative spondylolisthesis   Discharged Condition: good  Hospital Course: Patient admitted to the hospital where she underwent uncomplicated L4-5 decompression and fusion surgery.  Postoperatively doing well.  Preoperative pain much improved.  Standing ambulating and voiding without difficulty.  Ready for discharge home.  Consults:   Significant Diagnostic Studies:   Treatments:   Discharge Exam: Blood pressure (!) 120/58, pulse 87, temperature 97.9 F (36.6 C), temperature source Oral, resp. rate 16, height 5\' 5"  (1.651 m), weight 104.8 kg, SpO2 95 %. Awake and alert.  Oriented and appropriate.  Motor and sensory function intact.  Wound clean and dry.  Chest and abdomen benign.  Disposition: Discharge disposition: 01-Home or Self Care        Allergies as of 09/23/2022   No Known Allergies      Medication List     STOP taking these medications    HYDROcodone-acetaminophen 7.5-325 MG tablet Commonly known as: NORCO       TAKE these medications    amLODipine 10 MG tablet Commonly known as: NORVASC Take 10 mg by mouth in the morning.   atorvastatin 40 MG tablet Commonly known as: LIPITOR Take 40 mg by mouth in the morning.   calcium carbonate 1500 (600 Ca) MG Tabs tablet Commonly known as: OSCAL Take 600 mg by mouth in the morning.   carvedilol 25 MG tablet Commonly known as: COREG Take 25 mg by mouth 2 (two) times daily.   chlorthalidone 50 MG tablet Commonly known as: HYGROTON Take 50 mg by mouth in the morning.   cholecalciferol 25 MCG (1000 UNIT) tablet Commonly known as: VITAMIN D3 Take 1,000 Units by mouth in the morning.   doxazosin 1 MG tablet Commonly known as: CARDURA Take 1 mg by mouth in the morning.    gabapentin 300 MG capsule Commonly known as: NEURONTIN Take 300 mg by mouth in the morning.   glipiZIDE 5 MG 24 hr tablet Commonly known as: GLUCOTROL XL Take 5 mg by mouth in the morning.   MAGnesium-Oxide 400 (240 Mg) MG tablet Generic drug: magnesium oxide Take 400 mg by mouth in the morning.   metFORMIN 500 MG 24 hr tablet Commonly known as: GLUCOPHAGE-XR Take 500 mg by mouth in the morning.   Oxycodone HCl 10 MG Tabs Take 1 tablet (10 mg total) by mouth every 3 (three) hours as needed for severe pain ((score 7 to 10)).   pioglitazone 15 MG tablet Commonly known as: ACTOS Take 15 mg by mouth in the morning.   QC Vitamin B12 5000 MCG Subl Generic drug: Cyanocobalamin Take 2,500 mcg by mouth in the morning.   tiZANidine 4 MG tablet Commonly known as: ZANAFLEX Take 1 tablet (4 mg total) by mouth at bedtime.               Durable Medical Equipment  (From admission, onward)           Start     Ordered   09/22/22 1214  DME Walker rolling  Once       Question:  Patient needs a walker to treat with the following condition  Answer:  Degenerative spondylolisthesis   09/22/22 1213   09/22/22 1214  DME 3 n 1  Once        09/22/22 1213  SignedCooper Render Alanee Frost 09/23/2022, 9:54 AM

## 2022-09-23 NOTE — Discharge Instructions (Signed)

## 2022-09-24 MED FILL — Sodium Chloride IV Soln 0.9%: INTRAVENOUS | Qty: 1000 | Status: AC

## 2022-09-24 MED FILL — Heparin Sodium (Porcine) Inj 1000 Unit/ML: INTRAMUSCULAR | Qty: 30 | Status: AC
